# Patient Record
Sex: Male | Born: 1990 | Race: Black or African American | Hispanic: No | Marital: Single | Smoking: Never smoker
Health system: Southern US, Community
[De-identification: ages and names within clinical notes are randomized; demographics above are authoritative.]

## PROBLEM LIST (undated history)

## (undated) DIAGNOSIS — T7840XA Allergy, unspecified, initial encounter: Secondary | ICD-10-CM

## (undated) HISTORY — DX: Allergy, unspecified, initial encounter: T78.40XA

---

## 2013-05-31 ENCOUNTER — Emergency Department (HOSPITAL_COMMUNITY): Payer: No Typology Code available for payment source

## 2013-05-31 ENCOUNTER — Emergency Department (HOSPITAL_COMMUNITY)
Admission: EM | Admit: 2013-05-31 | Discharge: 2013-05-31 | Disposition: A | Discharge status: Home / Self Care | Payer: No Typology Code available for payment source | Attending: Emergency Medicine | Admitting: Emergency Medicine

## 2013-05-31 ENCOUNTER — Encounter (HOSPITAL_COMMUNITY): Payer: Self-pay | Admitting: Emergency Medicine

## 2013-05-31 DIAGNOSIS — S161XXA Strain of muscle, fascia and tendon at neck level, initial encounter: Secondary | ICD-10-CM

## 2013-05-31 DIAGNOSIS — IMO0002 Reserved for concepts with insufficient information to code with codable children: Secondary | ICD-10-CM | POA: Insufficient documentation

## 2013-05-31 DIAGNOSIS — S79919A Unspecified injury of unspecified hip, initial encounter: Secondary | ICD-10-CM | POA: Insufficient documentation

## 2013-05-31 DIAGNOSIS — Y92410 Unspecified street and highway as the place of occurrence of the external cause: Secondary | ICD-10-CM | POA: Insufficient documentation

## 2013-05-31 DIAGNOSIS — Y9389 Activity, other specified: Secondary | ICD-10-CM | POA: Insufficient documentation

## 2013-05-31 DIAGNOSIS — Z23 Encounter for immunization: Secondary | ICD-10-CM | POA: Insufficient documentation

## 2013-05-31 DIAGNOSIS — S139XXA Sprain of joints and ligaments of unspecified parts of neck, initial encounter: Secondary | ICD-10-CM | POA: Insufficient documentation

## 2013-05-31 DIAGNOSIS — S79912A Unspecified injury of left hip, initial encounter: Secondary | ICD-10-CM

## 2013-05-31 DIAGNOSIS — T07XXXA Unspecified multiple injuries, initial encounter: Secondary | ICD-10-CM

## 2013-05-31 MED ORDER — TETANUS-DIPHTH-ACELL PERTUSSIS 5-2.5-18.5 LF-MCG/0.5 IM SUSP
0.5000 mL | Freq: Once | INTRAMUSCULAR | Status: AC
  Administered 2013-05-31: 0.5 mL via INTRAMUSCULAR
  Filled 2013-05-31: qty 0.5

## 2013-05-31 MED ORDER — IBUPROFEN 800 MG PO TABS: 800.0000 mg | ORAL_TABLET | Freq: Three times a day (TID) | ORAL | Status: DC

## 2013-05-31 MED ORDER — FLUORESCEIN SODIUM 1 MG OP STRP
1.0000 | ORAL_STRIP | Freq: Once | OPHTHALMIC | Status: AC
  Administered 2013-05-31: 2 via OPHTHALMIC
  Filled 2013-05-31: qty 1

## 2013-05-31 MED ORDER — KETOROLAC TROMETHAMINE 60 MG/2ML IM SOLN
60.0000 mg | Freq: Once | INTRAMUSCULAR | Status: AC
  Administered 2013-05-31: 60 mg via INTRAMUSCULAR
  Filled 2013-05-31: qty 2

## 2013-05-31 MED ORDER — HYDROCODONE-ACETAMINOPHEN 5-325 MG PO TABS: 1.0000 | ORAL_TABLET | Freq: Four times a day (QID) | ORAL | Status: DC | PRN

## 2013-05-31 MED ORDER — TETRACAINE HCL 0.5 % OP SOLN
2.0000 [drp] | Freq: Once | OPHTHALMIC | Status: AC
  Administered 2013-05-31: 2 [drp] via OPHTHALMIC
  Filled 2013-05-31: qty 2

## 2013-05-31 MED ORDER — CYCLOBENZAPRINE HCL 10 MG PO TABS: 10.0000 mg | ORAL_TABLET | Freq: Two times a day (BID) | ORAL | Status: DC | PRN

## 2013-05-31 NOTE — ED Notes (Signed)
Pt transported to XR.  
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
## 2013-05-31 NOTE — ED Notes (Signed)
Pt returned from XR, placed back on monitor. 

## 2013-05-31 NOTE — ED Notes (Signed)
Dr. Dierdre Highman in at the bedside.
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
## 2013-05-31 NOTE — ED Notes (Signed)
Pt requesting pain medication. RN to give held toradol

## 2013-05-31 NOTE — ED Notes (Signed)
Pt offered toradol for pain control, but pt does not want pain medicine at this time. RN instructed pt to alert RN if he changes his mind.

## 2014-07-21 ENCOUNTER — Ambulatory Visit (INDEPENDENT_AMBULATORY_CARE_PROVIDER_SITE_OTHER): Payer: BC Managed Care – PPO | Admitting: Emergency Medicine

## 2014-07-21 VITALS — BP 138/72 | HR 68 | Temp 97.9°F | Resp 16 | Ht 67.0 in | Wt 160.0 lb

## 2014-07-21 DIAGNOSIS — L03011 Cellulitis of right finger: Secondary | ICD-10-CM

## 2014-07-21 MED ORDER — SULFAMETHOXAZOLE-TMP DS 800-160 MG PO TABS: 1.0000 | ORAL_TABLET | Freq: Two times a day (BID) | ORAL | Status: DC

## 2014-07-21 NOTE — Patient Instructions (Signed)

## 2014-07-21 NOTE — Progress Notes (Signed)
[facility] [address], [place] [id number] [phone]  Date:  07/21/2014   Name:  Henry MiyamotoKeith Hall   DOB:  1991-09-06   MRN:  [id number]  PCP:  No PCP Per Patient    Chief Complaint: Hand Pain   History of Present Illness:  Henry Hall is a 23 y.o. very pleasant male patient who presents with the following:  Increasingly more painful right fourth finger.  Now has purulent accumulation. No fever or chills.  No history of injury. No improvement with over the counter medications or other home remedies.  Denies other complaint or health concern today.   There are no active problems to display for this patient.   History reviewed. No pertinent past medical history.  History reviewed. No pertinent past surgical history.  History  Substance Use Topics  . Smoking status: Never Smoker   . Smokeless tobacco: Not on file  . Alcohol Use: Not on file    History reviewed. No pertinent family history.  No Known Allergies  Medication list has been reviewed and updated.  No current outpatient prescriptions on file prior to visit.   No current facility-administered medications on file prior to visit.    Review of Systems:  As per HPI, otherwise negative.    Physical Examination: Filed Vitals:   07/21/14 1804  BP: 138/72  Pulse: 68  Temp: 97.9 F (36.6 C)  Resp: 16   Filed Vitals:   07/21/14 1804  Height: 5\' 7"  (1.702 m)  Weight: 160 lb (72.576 kg)   Body mass index is 25.05 kg/(m^2). Ideal Body Weight: Weight in (lb) to have BMI = 25: 159.3   GEN: WDWN, NAD, Non-toxic, Alert & Oriented x 3 HEENT: Atraumatic, Normocephalic.  Ears and Nose: No external deformity. EXTR: No clubbing/cyanosis/edema NEURO: Normal gait.  PSYCH: Normally interactive. Conversant. Not depressed or anxious appearing.  Calm demeanor.  Right hand:  Fourth finger paronychia.   Assessment and Plan: Paronychia Aspiration Septra   Signed,  Henry OdorJeffery Deklyn Trachtenberg,  MD

## 2015-06-13 ENCOUNTER — Emergency Department (HOSPITAL_COMMUNITY)
Admission: EM | Admit: 2015-06-13 | Discharge: 2015-06-13 | Disposition: A | Discharge status: Home / Self Care | Payer: BLUE CROSS/BLUE SHIELD | Source: Home / Self Care | Attending: Family Medicine | Admitting: Family Medicine

## 2015-06-13 ENCOUNTER — Encounter (HOSPITAL_COMMUNITY): Payer: Self-pay | Admitting: *Deleted

## 2015-06-13 ENCOUNTER — Emergency Department (INDEPENDENT_AMBULATORY_CARE_PROVIDER_SITE_OTHER): Payer: BLUE CROSS/BLUE SHIELD

## 2015-06-13 DIAGNOSIS — S63259A Unspecified dislocation of unspecified finger, initial encounter: Secondary | ICD-10-CM | POA: Diagnosis not present

## 2015-06-13 NOTE — ED Notes (Signed)
Assessment per Dr. Corey. 

## 2015-06-13 NOTE — ED Notes (Signed)
Finger splint applied per Dr. Denyse Amass.

## 2015-06-13 NOTE — Discharge Instructions (Signed)
Thank you for coming in today. Take up to 2 Aleve twice daily Use the splint Follow-up with me in a week   Finger Dislocation Finger dislocation is the displacement of bones in your finger at the joints. Most commonly, finger dislocation occurs at the proximal interphalangeal joint (the joint closest to your knuckle). Very strong, fibrous tissues (ligaments) and joint capsules connect the three bones of your fingers.  CAUSES Dislocation is caused by a forceful impact. This impact moves these bones off the joint and often tears your ligaments.  SYMPTOMS Symptoms of finger dislocation include:  Deformity of your finger.  Pain, with loss of movement. DIAGNOSIS  Finger dislocation is diagnosed with a physical exam. Often, X-ray exams are done to see if you have associated injuries, such as bone fractures. TREATMENT  Finger dislocations are treated by putting your bones back into position (reduction) either by manually moving the bones back into place or through surgery. Your finger is then kept in a fixed position (immobilized) with the use of a dressing or splint for a brief period. When your ligament has to be surgically repaired, it needs to be kept in a fixed position with a dressing or splint for 1 to 2 weeks. Because joint stiffness is a long-term complication of finger dislocation, hand exercises or physical therapy to increase the range of motion and to regain strength is usually started as soon as the ligament is healed. Exercises and therapy generally last no more than 3 months. HOME CARE INSTRUCTIONS The following measures can help to reduce pain and speed up the healing process:  Rest your injured joint. Do not move until instructed otherwise by your caregiver. Avoid activities similar to the one that caused your injury.  Apply ice to your injured joint for the first day or 2 after your reduction or as directed by your caregiver. Applying ice helps to reduce inflammation and  pain.  Put ice in a plastic bag.  Place a towel between your skin and the bag.  Leave the ice on for 15-20 minutes at a time, every 2 hours while you are awake.  Elevate your hand above your heart as directed by your caregiver to reduce swelling.  Take over-the-counter or prescription medicine for pain as your caregiver instructs you. SEEK IMMEDIATE MEDICAL CARE IF:  Your dressing or splint becomes damaged.  Your pain becomes worse rather than better.  You lose feeling in your finger, or it becomes cold and white. MAKE SURE YOU:  Understand these instructions.  Will watch your condition.  Will get help right away if you are not doing well or get worse. Document Released: 09/16/2000 Document Revised: 12/12/2011 Document Reviewed: 07/10/2011 [facility] Patient Information 2015 [place], [place]. This information is not intended to replace advice given to you by your health care provider. Make sure you discuss any questions you have with your health care provider.

## 2015-06-13 NOTE — ED Provider Notes (Signed)
Henry Hall is a 24 y.o. male who presents to [facility] today for left fifth digit PIP dislocation. Patient suffered a dislocation to his left fifth PIP joint of his left hand playing flag football at school today. This is his second dislocation of the same joint. He notes pain and deformity. He has not tried any treatment yet.   History reviewed. No pertinent past medical history. History reviewed. No pertinent past surgical history. Social History  Substance Use Topics  . Smoking status: Never Smoker   . Smokeless tobacco: Not on file  . Alcohol Use: Not on file   ROS as above Medications: No current facility-administered medications for this encounter.   No current outpatient prescriptions on file.   No Known Allergies   Exam:  BP 137/83 mmHg  Pulse 115  Temp(Src) 98.6 F (37 C) (Oral)  Resp 16  SpO2 99% Gen: Well NAD HEENT: EOMI,  MMM Lungs: Normal work of breathing. CTABL Heart: RRR no MRG heart rate following joint reduction 80 bpm Abd: NABS, Soft. Nondistended, Nontender Exts: Brisk capillary refill, warm and well perfused.   Reduction of dislocation: The finger was distracted and flexed. The joint easily was reduced back into a more anatomical position. Patient notes pain improved.  No results found for this or any previous visit (from the past 24 hour(s)). Dg Finger Little Left  06/13/2015   CLINICAL DATA:  Left finger dislocation after pulling injury. Initial encounter.  EXAM: LEFT LITTLE FINGER 2+V  COMPARISON:  None.  FINDINGS: No dislocation is noted. Small bone fragment is seen posterior to the proximal interphalangeal joint of the fifth finger concerning for small avulsion fracture of indeterminate age joint spaces are otherwise intact. Mild soft tissue swelling is noted. No radiopaque foreign body seen.  IMPRESSION: Probable small avulsion fracture seen involving proximal base of fifth middle phalanx of indeterminate age. No dislocation is noted currently.    Electronically Signed   By: Lupita Raider, M.D.   On: 06/13/2015 18:12    Assessment and Plan: 24 y.o. male with dislocation status post reduction with dorsal avulsion fracture. Patient was splinted across the PIP with free motion of the DIP. He will follow-up with me at the sports medicine in a week.  Discussed warning signs or symptoms. Please see discharge instructions. Patient expresses understanding.     Rodolph Bong, MD 06/13/15 [phone]

## 2015-08-25 ENCOUNTER — Ambulatory Visit (INDEPENDENT_AMBULATORY_CARE_PROVIDER_SITE_OTHER): Payer: BLUE CROSS/BLUE SHIELD | Admitting: Emergency Medicine

## 2015-08-25 VITALS — BP 126/80 | HR 65 | Temp 97.6°F | Resp 16 | Ht 68.0 in | Wt 157.0 lb

## 2015-08-25 DIAGNOSIS — J209 Acute bronchitis, unspecified: Secondary | ICD-10-CM | POA: Diagnosis not present

## 2015-08-25 DIAGNOSIS — J0140 Acute pansinusitis, unspecified: Secondary | ICD-10-CM | POA: Diagnosis not present

## 2015-08-25 MED ORDER — ALBUTEROL SULFATE (2.5 MG/3ML) 0.083% IN NEBU
5.0000 mg | INHALATION_SOLUTION | Freq: Once | RESPIRATORY_TRACT | Status: AC
  Administered 2015-08-25: 5 mg via RESPIRATORY_TRACT

## 2015-08-25 MED ORDER — PSEUDOEPHEDRINE-GUAIFENESIN ER 60-600 MG PO TB12: 1.0000 | ORAL_TABLET | Freq: Two times a day (BID) | ORAL | Status: DC

## 2015-08-25 MED ORDER — AMOXICILLIN-POT CLAVULANATE 875-125 MG PO TABS: 1.0000 | ORAL_TABLET | Freq: Two times a day (BID) | ORAL | Status: DC

## 2015-08-25 MED ORDER — HYDROCOD POLST-CPM POLST ER 10-8 MG/5ML PO SUER: 5.0000 mL | Freq: Two times a day (BID) | ORAL | Status: DC

## 2015-08-25 MED ORDER — IPRATROPIUM BROMIDE 0.02 % IN SOLN
0.5000 mg | Freq: Once | RESPIRATORY_TRACT | Status: AC
  Administered 2015-08-25: 0.5 mg via RESPIRATORY_TRACT

## 2015-08-25 MED ORDER — ALBUTEROL SULFATE HFA 108 (90 BASE) MCG/ACT IN AERS: 2.0000 | INHALATION_SPRAY | RESPIRATORY_TRACT | Status: DC | PRN

## 2015-08-25 NOTE — Patient Instructions (Signed)
Metered Dose Inhaler (No Spacer Used) Inhaled medicines are the basis of treatment for asthma and other breathing problems. Inhaled medicine can only be effective if used properly. Good technique assures that the medicine reaches the lungs. Metered dose inhalers (MDIs) are used to deliver a variety of inhaled medicines. These include quick relief or rescue medicines (such as bronchodilators) and controller medicines (such as corticosteroids). The medicine is delivered by pushing down on a metal canister to release a set amount of spray. If you are using different kinds of inhalers, use your quick relief medicine to open the airways 10-15 minutes before using a steroid, if instructed to do so by your health care provider. If you are unsure which inhalers to use and the order of using them, ask your health care provider, nurse, or respiratory therapist. HOW TO USE THE INHALER 1. Remove the cap from the inhaler. 2. If you are using the inhaler for the first time, you will need to prime it. Shake the inhaler for 5 seconds and release four puffs into the air, away from your face. Ask your health care provider or pharmacist if you have questions about priming your inhaler. 3. Shake the inhaler for 5 seconds before each breath in (inhalation). 4. Position the inhaler so that the top of the canister faces up. 5. Put your index finger on the top of the medicine canister. Your thumb supports the bottom of the inhaler. 6. Open your mouth. 7. Either place the inhaler between your teeth and place your lips tightly around the mouthpiece, or hold the inhaler 1-2 inches away from your open mouth. If you are unsure of which technique to use, ask your health care provider. 8. Breathe out (exhale) normally and as completely as possible. 9. Press the canister down with the index finger to release the medicine. 10. At the same time as the canister is pressed, inhale deeply and slowly until your lungs are completely filled.  This should take 4-6 seconds. Keep your tongue down. 11. Hold the medicine in your lungs for 5-10 seconds (10 seconds is best). This helps the medicine get into the small airways of your lungs. 12. Breathe out slowly, through pursed lips. Whistling is an example of pursed lips. 13. Wait at least 1 minute between puffs. Continue with the above steps until you have taken the number of puffs your health care provider has ordered. Do not use the inhaler more than your health care provider directs you to. 14. Replace the cap on the inhaler. 15. Follow the directions from your health care provider or the inhaler insert for cleaning the inhaler. If you are using a steroid inhaler, after your last puff, rinse your mouth with water, gargle, and spit out the water. Do not swallow the water. AVOID:  Inhaling before or after starting the spray of medicine. It takes practice to coordinate your breathing with triggering the spray.  Inhaling through the nose (rather than the mouth) when triggering the spray. HOW TO DETERMINE IF YOUR INHALER IS FULL OR NEARLY EMPTY You cannot know when an inhaler is empty by shaking it. Some inhalers are now being made with dose counters. Ask your health care provider for a prescription that has a dose counter if you feel you need that extra help. If your inhaler does not have a counter, ask your health care provider to help you determine the date you need to refill your inhaler. Write the refill date on a calendar or your inhaler canister. Refill   your inhaler 7-10 days before it runs out. Be sure to keep an adequate supply of medicine. This includes making sure it has not expired, and making sure you have a spare inhaler. SEEK MEDICAL CARE IF:  Symptoms are only partially relieved with your inhaler.  You are having trouble using your inhaler.  You experience an increase in phlegm. SEEK IMMEDIATE MEDICAL CARE IF:  You feel little or no relief with your inhalers. You are still  wheezing and feeling shortness of breath, tightness in your chest, or both.  You have dizziness, headaches, or a fast heart rate.  You have chills, fever, or night sweats.  There is a noticeable increase in phlegm production, or there is blood in the phlegm. MAKE SURE YOU:  Understand these instructions.  Will watch your condition.  Will get help right away if you are not doing well or get worse.   This information is not intended to replace advice given to you by your health care provider. Make sure you discuss any questions you have with your health care provider.   Document Released: 07/17/2007 Document Revised: 10/10/2014 Document Reviewed: 03/07/2013 [employer] Interactive Patient Education 2016 [employer]  

## 2015-08-25 NOTE — Progress Notes (Signed)
Subjective:  Patient ID: Henry Hall, male    DOB: 03/08/1991  Age: 24 y.o. MRN: [id number]  CC: Sore Throat; Cough; and Wheezing   HPI Henry MiyamotoKeith Hall presents  with nasal congestion nasal discharge is purulent color. He has postnasal drip. He has a cough productive of purulent sputum. He has moderate wheezing. He has used an inhaler one time in his life previously for an upper respiratory infection with reactive airway disease. He has no fever or chills. He has marked pressure in and around his eyes. He's had no improvement with over-the-counter medication  History Henry Hall has no past medical history on file.   He has no past surgical history on file.   His  family history is not on file.  He   reports that he has never smoked. He does not have any smokeless tobacco history on file. His alcohol and drug histories are not on file.  No outpatient prescriptions prior to visit.   No facility-administered medications prior to visit.    Social History   Social History  . Marital Status: Single    Spouse Name: N/A  . Number of Children: N/A  . Years of Education: N/A   Social History Main Topics  . Smoking status: Never Smoker   . Smokeless tobacco: None  . Alcohol Use: None  . Drug Use: None  . Sexual Activity: Not Asked   Other Topics Concern  . None   Social History Narrative     Review of Systems  Constitutional: Positive for fatigue. Negative for fever, chills and appetite change.  HENT: Positive for congestion, postnasal drip, rhinorrhea and sinus pressure. Negative for ear pain and sore throat.   Eyes: Negative for pain and redness.  Respiratory: Positive for cough and wheezing. Negative for shortness of breath.   Cardiovascular: Negative for leg swelling.  Gastrointestinal: Negative for nausea, vomiting, abdominal pain, diarrhea, constipation and blood in stool.  Endocrine: Negative for polyuria.  Genitourinary: Negative for dysuria, urgency, frequency and  flank pain.  Musculoskeletal: Negative for gait problem.  Skin: Negative for rash.  Neurological: Negative for weakness and headaches.  Psychiatric/Behavioral: Negative for confusion and decreased concentration. The patient is not nervous/anxious.     Objective:  BP 126/80 mmHg  Pulse 65  Temp(Src) 97.6 F (36.4 C) (Oral)  Resp 16  Ht 5\' 8"  (1.727 m)  Wt 157 lb (71.215 kg)  BMI 23.88 kg/m2  SpO2 98%  Physical Exam  Constitutional: He is oriented to person, place, and time. He appears well-developed and well-nourished. No distress.  HENT:  Head: Normocephalic and atraumatic.  Right Ear: External ear normal.  Left Ear: External ear normal.  Nose: Nose normal.  Eyes: Conjunctivae and EOM are normal. Pupils are equal, round, and reactive to light. No scleral icterus.  Neck: Normal range of motion. Neck supple. No tracheal deviation present.  Cardiovascular: Normal rate, regular rhythm and normal heart sounds.   Pulmonary/Chest: Effort normal. No respiratory distress. He has no wheezes. He has no rales.  Abdominal: He exhibits no mass. There is no tenderness. There is no rebound and no guarding.  Musculoskeletal: He exhibits no edema.  Lymphadenopathy:    He has no cervical adenopathy.  Neurological: He is alert and oriented to person, place, and time. Coordination normal.  Skin: Skin is warm and dry. No rash noted.  Psychiatric: He has a normal mood and affect. His behavior is normal.      Assessment & Plan:   Henry Hall was seen  today for sore throat, cough and wheezing.  Diagnoses and all orders for this visit:  Acute bronchitis, unspecified organism -     albuterol (PROVENTIL) (2.5 MG/3ML) 0.083% nebulizer solution 5 mg; Take 6 mLs (5 mg total) by nebulization once. -     ipratropium (ATROVENT) nebulizer solution 0.5 mg; Take 2.5 mLs (0.5 mg total) by nebulization once.  Acute pansinusitis, recurrence not specified  Other orders -     albuterol (PROVENTIL HFA;VENTOLIN  HFA) 108 (90 BASE) MCG/ACT inhaler; Inhale 2 puffs into the lungs every 4 (four) hours as needed for wheezing or shortness of breath (cough, shortness of breath or wheezing.). -     pseudoephedrine-guaifenesin (MUCINEX D) 60-600 MG 12 hr tablet; Take 1 tablet by mouth every 12 (twelve) hours. -     chlorpheniramine-HYDROcodone (TUSSIONEX PENNKINETIC ER) 10-8 MG/5ML SUER; Take 5 mLs by mouth 2 (two) times daily. -     amoxicillin-clavulanate (AUGMENTIN) 875-125 MG tablet; Take 1 tablet by mouth 2 (two) times daily.  I am having Henry Hall start on albuterol, pseudoephedrine-guaifenesin, chlorpheniramine-HYDROcodone, and amoxicillin-clavulanate. We administered albuterol and ipratropium.  Meds ordered this encounter  Medications  . albuterol (PROVENTIL) (2.5 MG/3ML) 0.083% nebulizer solution 5 mg    Sig:   . ipratropium (ATROVENT) nebulizer solution 0.5 mg    Sig:   . albuterol (PROVENTIL HFA;VENTOLIN HFA) 108 (90 BASE) MCG/ACT inhaler    Sig: Inhale 2 puffs into the lungs every 4 (four) hours as needed for wheezing or shortness of breath (cough, shortness of breath or wheezing.).    Dispense:  1 Inhaler    Refill:  1  . pseudoephedrine-guaifenesin (MUCINEX D) 60-600 MG 12 hr tablet    Sig: Take 1 tablet by mouth every 12 (twelve) hours.    Dispense:  18 tablet    Refill:  0  . chlorpheniramine-HYDROcodone (TUSSIONEX PENNKINETIC ER) 10-8 MG/5ML SUER    Sig: Take 5 mLs by mouth 2 (two) times daily.    Dispense:  60 mL    Refill:  0  . amoxicillin-clavulanate (AUGMENTIN) 875-125 MG tablet    Sig: Take 1 tablet by mouth 2 (two) times daily.    Dispense:  20 tablet    Refill:  0   He had good improvement with nebulized aerosol. He'll follow-up after he completes antibiotic for new or worse symptoms  Appropriate red flag conditions were discussed with the patient as well as actions that should be taken.  Patient expressed his understanding.  Follow-up: Return if symptoms worsen or fail to  improve.  Carmelina Dane, MD

## 2015-11-10 ENCOUNTER — Ambulatory Visit (INDEPENDENT_AMBULATORY_CARE_PROVIDER_SITE_OTHER): Payer: BLUE CROSS/BLUE SHIELD | Admitting: Family Medicine

## 2015-11-10 VITALS — BP 130/70 | HR 87 | Temp 98.3°F | Resp 16 | Ht 68.0 in | Wt 157.0 lb

## 2015-11-10 DIAGNOSIS — J209 Acute bronchitis, unspecified: Secondary | ICD-10-CM

## 2015-11-10 MED ORDER — HYDROCODONE-HOMATROPINE 5-1.5 MG/5ML PO SYRP: 5.0000 mL | ORAL_SOLUTION | Freq: Three times a day (TID) | ORAL | Status: DC | PRN

## 2015-11-10 MED ORDER — ALBUTEROL SULFATE HFA 108 (90 BASE) MCG/ACT IN AERS: 2.0000 | INHALATION_SPRAY | RESPIRATORY_TRACT | Status: DC | PRN

## 2015-11-10 MED ORDER — AZITHROMYCIN 250 MG PO TABS: ORAL_TABLET | ORAL | Status: DC

## 2015-11-10 MED ORDER — ALBUTEROL SULFATE (2.5 MG/3ML) 0.083% IN NEBU
2.5000 mg | INHALATION_SOLUTION | Freq: Once | RESPIRATORY_TRACT | Status: AC
  Administered 2015-11-10: 2.5 mg via RESPIRATORY_TRACT

## 2015-11-10 NOTE — Patient Instructions (Signed)
Asthma, Acute Bronchospasm Acute bronchospasm caused by asthma is also referred to as an asthma attack. Bronchospasm means your air passages become narrowed. The narrowing is caused by inflammation and tightening of the muscles in the air tubes (bronchi) in your lungs. This can make it hard to breathe or cause you to wheeze and cough. CAUSES Possible triggers are:  Animal dander from the skin, hair, or feathers of animals.  Dust mites contained in house dust.  Cockroaches.  Pollen from trees or grass.  Mold.  Cigarette or tobacco smoke.  Air pollutants such as dust, household cleaners, hair sprays, aerosol sprays, paint fumes, strong chemicals, or strong odors.  Cold air or weather changes. Cold air may trigger inflammation. Winds increase molds and pollens in the air.  Strong emotions such as crying or laughing hard.  Stress.  Certain medicines such as aspirin or beta-blockers.  Sulfites in foods and drinks, such as dried fruits and wine.  Infections or inflammatory conditions, such as a flu, cold, or inflammation of the nasal membranes (rhinitis).  Gastroesophageal reflux disease (GERD). GERD is a condition where stomach acid backs up into your esophagus.  Exercise or strenuous activity. SIGNS AND SYMPTOMS   Wheezing.  Excessive coughing, particularly at night.  Chest tightness.  Shortness of breath. DIAGNOSIS  Your health care provider will ask you about your medical history and perform a physical exam. A chest X-ray or blood testing may be performed to look for other causes of your symptoms or other conditions that may have triggered your asthma attack. TREATMENT  Treatment is aimed at reducing inflammation and opening up the airways in your lungs. Most asthma attacks are treated with inhaled medicines. These include quick relief or rescue medicines (such as bronchodilators) and controller medicines (such as inhaled corticosteroids). These medicines are sometimes  given through an inhaler or a nebulizer. Systemic steroid medicine taken by mouth or given through an IV tube also can be used to reduce the inflammation when an attack is moderate or severe. Antibiotic medicines are only used if a bacterial infection is present.  HOME CARE INSTRUCTIONS   Rest.  Drink plenty of liquids. This helps the mucus to remain thin and be easily coughed up. Only use caffeine in moderation and do not use alcohol until you have recovered from your illness.  Do not smoke. Avoid being exposed to secondhand smoke.  You play a critical role in keeping yourself in good health. Avoid exposure to things that cause you to wheeze or to have breathing problems.  Keep your medicines up-to-date and available. Carefully follow your health care provider's treatment plan.  Take your medicine exactly as prescribed.  When pollen or pollution is bad, keep windows closed and use an air conditioner or go to places with air conditioning.  Asthma requires careful medical care. See your health care provider for a follow-up as advised. If you are more than [redacted] weeks pregnant and you were prescribed any new medicines, let your obstetrician know about the visit and how you are doing. Follow up with your health care provider as directed.  After you have recovered from your asthma attack, make an appointment with your outpatient doctor to talk about ways to reduce the likelihood of future attacks. If you do not have a doctor who manages your asthma, make an appointment with a primary care doctor to discuss your asthma. SEEK IMMEDIATE MEDICAL CARE IF:   You are getting worse.  You have trouble breathing. If severe, call your local   emergency services (911 in the [place]  You develop chest pain or discomfort.  You are vomiting.  You are not able to keep fluids down.  You are coughing up yellow, green, brown, or bloody sputum.  You have a fever and your symptoms suddenly get worse.  You have  trouble swallowing. MAKE SURE YOU:   Understand these instructions.  Will watch your condition.  Will get help right away if you are not doing well or get worse.   This information is not intended to replace advice given to you by your health care provider. Make sure you discuss any questions you have with your health care provider.   Document Released: 01/04/2007 Document Revised: 09/24/2013 Document Reviewed: 03/27/2013 Elsevier Interactive Patient Education 2016 [employer] Acute Bronchitis Bronchitis is inflammation of the airways that extend from the windpipe into the lungs (bronchi). The inflammation often causes mucus to develop. This leads to a cough, which is the most common symptom of bronchitis.  In acute bronchitis, the condition usually develops suddenly and goes away over time, usually in a couple weeks. Smoking, allergies, and asthma can make bronchitis worse. Repeated episodes of bronchitis may cause further lung problems.  CAUSES Acute bronchitis is most often caused by the same virus that causes a cold. The virus can spread from person to person (contagious) through coughing, sneezing, and touching contaminated objects. SIGNS AND SYMPTOMS   Cough.   Fever.   Coughing up mucus.   Body aches.   Chest congestion.   Chills.   Shortness of breath.   Sore throat.  DIAGNOSIS  Acute bronchitis is usually diagnosed through a physical exam. Your health care provider will also ask you questions about your medical history. Tests, such as chest X-rays, are sometimes done to rule out other conditions.  TREATMENT  Acute bronchitis usually goes away in a couple weeks. Oftentimes, no medical treatment is necessary. Medicines are sometimes given for relief of fever or cough. Antibiotic medicines are usually not needed but may be prescribed in certain situations. In some cases, an inhaler may be recommended to help reduce shortness of breath and control the cough. A  cool mist vaporizer may also be used to help thin bronchial secretions and make it easier to clear the chest.  HOME CARE INSTRUCTIONS  Get plenty of rest.   Drink enough fluids to keep your urine clear or pale yellow (unless you have a medical condition that requires fluid restriction). Increasing fluids may help thin your respiratory secretions (sputum) and reduce chest congestion, and it will prevent dehydration.   Take medicines only as directed by your health care provider.  If you were prescribed an antibiotic medicine, finish it all even if you start to feel better.  Avoid smoking and secondhand smoke. Exposure to cigarette smoke or irritating chemicals will make bronchitis worse. If you are a smoker, consider using nicotine gum or skin patches to help control withdrawal symptoms. Quitting smoking will help your lungs heal faster.   Reduce the chances of another bout of acute bronchitis by washing your hands frequently, avoiding people with cold symptoms, and trying not to touch your hands to your mouth, nose, or eyes.   Keep all follow-up visits as directed by your health care provider.  SEEK MEDICAL CARE IF: Your symptoms do not improve after 1 week of treatment.  SEEK IMMEDIATE MEDICAL CARE IF:  You develop an increased fever or chills.   You have chest pain.   You have severe shortness of   breath.  You have bloody sputum.   You develop dehydration.  You faint or repeatedly feel like you are going to pass out.  You develop repeated vomiting.  You develop a severe headache. MAKE SURE YOU:   Understand these instructions.  Will watch your condition.  Will get help right away if you are not doing well or get worse.   This information is not intended to replace advice given to you by your health care provider. Make sure you discuss any questions you have with your health care provider.   Document Released: 10/27/2004 Document Revised: 10/10/2014 Document  Reviewed: 03/12/2013 Elsevier Interactive Patient Education 2016 Elsevier Inc[employer]  

## 2015-11-10 NOTE — Progress Notes (Signed)
Subjective:  By signing my name below, I, Henry Hall, attest that this documentation has been prepared under the direction and in the presence of Norberto Sorenson, MD.  Henry Hall, Medical Scribe. 11/10/2015.  11:53 AM.    Patient ID: Henry Hall, male    DOB: 31-May-1991, 25 y.o.   MRN: [id number]  Chief Complaint  Patient presents with  . Cough  . URI  . Sinusitis    HPI HPI Comments: Henry Hall is a 25 y.o. male who presents to [facility] complaining of possible URI, gradual onset 3 days ago.  Pt was on Augmentin several months prior for sinus infection. Pt was prescribed an inhaler for this. Pt notes that he has associated symptoms of cough, chest congestion, wheezing, sinus pressure, and sleep disturbance secondary to coughing episodes. Pt has used some OTC medications for this, and notes finding good relief with it. Pt does not reports a history of asthma. He is not a smoker, however notes that his work environment is surrounded by smoke and dust particles. Pt does have a family history of asthma. Pt denies chest pain , sore throat., or shortness of breath.   Pt works as a Psychologist, occupational.    There are no active problems to display for this patient.  No past medical history on file. No past surgical history on file. No Known Allergies Prior to Admission medications   Medication Sig Start Date End Date Taking? Authorizing Provider  albuterol (PROVENTIL HFA;VENTOLIN HFA) 108 (90 BASE) MCG/ACT inhaler Inhale 2 puffs into the lungs every 4 (four) hours as needed for wheezing or shortness of breath (cough, shortness of breath or wheezing.). Patient not taking: Reported on 11/10/2015 08/25/15   Carmelina Dane, MD   Social History   Social History  . Marital Status: Single    Spouse Name: N/A  . Number of Children: N/A  . Years of Education: N/A   Occupational History  . Not on file.   Social History Main Topics  . Smoking status: Never Smoker   .  Smokeless tobacco: Not on file  . Alcohol Use: Not on file  . Drug Use: Not on file  . Sexual Activity: Not on file   Other Topics Concern  . Not on file   Social History Narrative    Review of Systems  HENT: Positive for congestion and sinus pressure. Negative for sore throat.   Respiratory: Positive for cough and wheezing. Negative for shortness of breath.   Cardiovascular: Negative for chest pain.  Psychiatric/Behavioral: Positive for sleep disturbance.      Objective:   Physical Exam  Constitutional: He is oriented to person, place, and time. He appears well-developed and well-nourished. No distress.  HENT:  Head: Normocephalic and atraumatic.  Right Ear: Tympanic membrane is erythematous. A middle ear effusion is present.  Left Ear: Tympanic membrane normal.  Purulent rhinitis right greater than left.  Oropharynx with erythema, 2+ tonsil, some edema, no exudate.   Eyes: EOM are normal. Pupils are equal, round, and reactive to light.  Neck: Neck supple.  Normal thyroid.   Cardiovascular: Normal rate.   Pulmonary/Chest: Effort normal. He has wheezes.  Dec air movement with expiratory wheezing throughout.   Lymphadenopathy:  Tonsillar adenopathy, and little anterior cervical adenopathy BL.  Neurological: He is alert and oriented to person, place, and time. No cranial nerve deficit.  Skin: Skin is warm and dry.  Psychiatric: He has a normal mood and affect. His  behavior is normal.  Nursing note and vitals reviewed.   BP 130/70 mmHg  Pulse 87  Temp(Src) 98.3 F (36.8 C) (Oral)  Resp 16  Ht  (1.727 m)  Wt 157 lb (71.215 kg)  BMI 23.88 kg/m2  SpO2 96%     Assessment & Plan:   1. Acute bronchitis, unspecified organism     Meds ordered this encounter  Medications  . albuterol (PROVENTIL) (2.5 MG/3ML) 0.083% nebulizer solution 2.5 mg    Sig:   . albuterol (PROVENTIL HFA;VENTOLIN HFA) 108 (90 Base) MCG/ACT inhaler    Sig: Inhale 2 puffs into the lungs  every 4 (four) hours as needed for wheezing or shortness of breath (cough, shortness of breath or wheezing.).    Dispense:  1 Inhaler    Refill:  2    Ok to substitute whatever type of albuterol inhaler is least expensive with his insurance.  Marland Kitchen azithromycin (ZITHROMAX) 250 MG tablet    Sig: Take 2 tabs PO x 1 dose, then 1 tab PO QD x 4 days    Dispense:  6 tablet    Refill:  0  . HYDROcodone-homatropine (HYCODAN) 5-1.5 MG/5ML syrup    Sig: Take 5 mLs by mouth every 8 (eight) hours as needed for cough. Do not take while working or driving.    Dispense:  120 mL    Refill:  0    I personally performed the services described in this documentation, which was scribed in my presence. The recorded information has been reviewed and considered, and addended by me as needed.  Norberto Sorenson, MD MPH

## 2016-01-14 ENCOUNTER — Ambulatory Visit (INDEPENDENT_AMBULATORY_CARE_PROVIDER_SITE_OTHER): Payer: BLUE CROSS/BLUE SHIELD | Admitting: Family Medicine

## 2016-01-14 VITALS — BP 126/74 | HR 64 | Temp 97.6°F | Resp 16 | Ht 68.0 in | Wt 159.0 lb

## 2016-01-14 DIAGNOSIS — J4530 Mild persistent asthma, uncomplicated: Secondary | ICD-10-CM | POA: Diagnosis not present

## 2016-01-14 DIAGNOSIS — J309 Allergic rhinitis, unspecified: Secondary | ICD-10-CM | POA: Diagnosis not present

## 2016-01-14 MED ORDER — BECLOMETHASONE DIPROPIONATE 80 MCG/ACT IN AERS: 1.0000 | INHALATION_SPRAY | Freq: Two times a day (BID) | RESPIRATORY_TRACT | Status: DC

## 2016-01-14 MED ORDER — FLUTICASONE PROPIONATE 50 MCG/ACT NA SUSP: 2.0000 | Freq: Every day | NASAL | Status: DC

## 2016-01-14 MED ORDER — ALBUTEROL SULFATE HFA 108 (90 BASE) MCG/ACT IN AERS: 1.0000 | INHALATION_SPRAY | RESPIRATORY_TRACT | Status: DC | PRN

## 2016-01-14 NOTE — Progress Notes (Signed)
Subjective:  By signing my name below, I, Stann Ore, attest that this documentation has been prepared under the direction and in the presence of Meredith Staggers, MD. Electronically Signed: Stann Ore, Scribe. 01/14/2016 , 11:01 AM .  Patient was seen in Room 5 .   Patient ID: Henry Hall, male    DOB: June 27, 1991, 25 y.o.   MRN: [id number] Chief Complaint  Patient presents with  . URI  . Allergies   HPI Henry Hall is a 25 y.o. male He was treated for acute bronchitis in February. Albuterol inhaler was given at that time for some wheezing. No known personal history of asthma, but does have family history (his mother) of asthma based on that visit.   He noticed sneezing with rhinorrhea, alternating nasal congestion and postnasal drainage starting back up recently. He states still having intermittent wheezing. He was previously prescribed zpak for relief, but states URI symptoms returned after the medication was finished. He was also prescribed albuterol inhaler, but he ran out last week; he was using it BID. He's taken zinc pills from a vitamin store. He's also been using flonase nasal spray, 3 sprays each nostril, a couple times a week mostly as needed. He informs taking Equate allergy pills daily. He denies fever. He denies any smoking.   He denies any shortness of breath, chest pain or wheezing when playing sports.   He works in [employer], around a lot of smoke and dust. He wears a mask at work.   There are no active problems to display for this patient.  No past medical history on file. No past surgical history on file. No Known Allergies Prior to Admission medications   Not on File   Social History   Social History  . Marital Status: Single    Spouse Name: N/A  . Number of Children: N/A  . Years of Education: N/A   Occupational History  . Not on file.   Social History Main Topics  . Smoking status: Never Smoker   . Smokeless tobacco: Not on file  . Alcohol Use:  Not on file  . Drug Use: Not on file  . Sexual Activity: Not on file   Other Topics Concern  . Not on file   Social History Narrative   Review of Systems  Constitutional: Negative for fever, chills and fatigue.  HENT: Positive for congestion, postnasal drip, rhinorrhea and sneezing.   Respiratory: Positive for wheezing. Negative for cough and shortness of breath.   Cardiovascular: Negative for chest pain.  Gastrointestinal: Negative for nausea, vomiting and diarrhea.      Objective:   Physical Exam  Constitutional: He is oriented to person, place, and time. He appears well-developed and well-nourished.  HENT:  Head: Normocephalic and atraumatic.  Right Ear: Tympanic membrane, external ear and ear canal normal.  Left Ear: Tympanic membrane, external ear and ear canal normal.  Nose: Mucosal edema (L>R) and rhinorrhea (minimal clear) present. Right sinus exhibits no maxillary sinus tenderness and no frontal sinus tenderness. Left sinus exhibits no maxillary sinus tenderness and no frontal sinus tenderness.  Mouth/Throat: Oropharynx is clear and moist and mucous membranes are normal. No oropharyngeal exudate or posterior oropharyngeal erythema.  Eyes: Conjunctivae are normal. Pupils are equal, round, and reactive to light.  Neck: Neck supple.  Cardiovascular: Normal rate, regular rhythm, normal heart sounds and intact distal pulses.   No murmur heard. Pulmonary/Chest: Effort normal and breath sounds normal. He has no wheezes. He has no rhonchi. He  has no rales.  Abdominal: Soft. There is no tenderness.  Lymphadenopathy:    He has no cervical adenopathy.  Neurological: He is alert and oriented to person, place, and time.  Skin: Skin is warm and dry. No rash noted.  Psychiatric: He has a normal mood and affect. His behavior is normal.  Vitals reviewed.   Filed Vitals:   01/14/16 1004  BP: 126/74  Pulse: 64  Temp: 97.6 F (36.4 C)  TempSrc: Oral  Resp: 16  Height:   (1.727 m)  Weight: 159 lb (72.122 kg)  SpO2: 98%    Spirometry FVC and FEV1 within normal limits. See flow chart    Assessment & Plan:   Wasyl Dornfeld is a 25 y.o. male Allergic rhinitis, unspecified allergic rhinitis type - Plan: fluticasone (FLONASE) 50 MCG/ACT nasal spray, CANCELED: Spirometry: Peak  Reactive airway disease with wheezing, mild persistent, uncomplicated - Plan: PFT PULM FXN SPIROMETRY (94010), albuterol (PROVENTIL HFA;VENTOLIN HFA) 108 (90 Base) MCG/ACT inhaler, beclomethasone (QVAR) 80 MCG/ACT inhaler, CANCELED: Spirometry: Peak  Allergic rhinitis with reactive airway versus undiagnosed asthma. Reassuring spirometry with Previous use of albuterol would indicate mild to moderate persistent, but may have been using albuterol when he did not have wheezing.  -Continue antihistamine once per day, Flonase nasal spray 2 sprays per nostril every day.  --Continue albuterol if needed for wheezing, but if frequent use of albuterol, or nighttime symptoms, start Qvar 80 g per inhalation twice per day.  -Follow-up in next 2-3 months. Sooner if worse.  Meds ordered this encounter  Medications  . albuterol (PROVENTIL HFA;VENTOLIN HFA) 108 (90 Base) MCG/ACT inhaler    Sig: Inhale 1-2 puffs into the lungs every 4 (four) hours as needed for wheezing or shortness of breath.    Dispense:  1 Inhaler    Refill:  0  . beclomethasone (QVAR) 80 MCG/ACT inhaler    Sig: Inhale 1 puff into the lungs 2 (two) times daily.    Dispense:  1 Inhaler    Refill:  2  . fluticasone (FLONASE) 50 MCG/ACT nasal spray    Sig: Place 2 sprays into both nostrils daily.    Dispense:  16 g    Refill:  6   Patient Instructions       IF you received an x-ray today, you will receive an invoice from [facility]. Please contact [facility] at [phone] with questions or concerns regarding your invoice.   IF you received labwork today, you will receive an invoice from [employer] [facility]. Please contact [facility] at [phone] with questions or concerns regarding your invoice.   Our billing staff will not be able to assist you with questions regarding bills from these companies.  You will be contacted with the lab results as soon as they are available. The fastest way to get your results is to activate your My Chart account. Instructions are located on the last page of this paperwork. If you have not heard from [place] regarding the results in 2 weeks, please contact this office.     Your symptoms appear to be due to allergies and possible asthma or reactive airway. For allergies, continue an antihistamine such as your tech, Claritin, or Allegra 1 pill once per day, as well as Flonase 2 sprays per nostril each day. I did send in some of this to your pharmacy.  For the wheezing, albuterol if only needed when wheezing.  If you are having to use albuterol more than twice per week,  or any nighttime wheezing symptoms, start the Qvar 1 puff inhaled twice per day, and follow-up with me in the next 2-3 months, sooner if any worsening of symptoms.  Return to the clinic or go to the nearest emergency room if any of your symptoms worsen or new symptoms occur.  Allergic Rhinitis Allergic rhinitis is when the mucous membranes in the nose respond to allergens. Allergens are particles in the air that cause your body to have an allergic reaction. This causes you to release allergic antibodies. Through a chain of events, these eventually cause you to release histamine into the blood stream. Although meant to protect the body, it is this release of histamine that causes your discomfort, such as frequent sneezing, congestion, and an itchy, runny nose.  CAUSES Seasonal allergic rhinitis (hay fever) is caused by pollen allergens that may come from grasses, trees, and weeds. Year-round allergic rhinitis (perennial allergic rhinitis) is caused by allergens such as house dust mites,  pet dander, and mold spores. SYMPTOMS  Nasal stuffiness (congestion).  Itchy, runny nose with sneezing and tearing of the eyes. DIAGNOSIS Your health care provider can help you determine the allergen or allergens that trigger your symptoms. If you and your health care provider are unable to determine the allergen, skin or blood testing may be used. Your health care provider will diagnose your condition after taking your health history and performing a physical exam. Your health care provider may assess you for other related conditions, such as asthma, pink eye, or an ear infection. TREATMENT Allergic rhinitis does not have a cure, but it can be controlled by:  Medicines that block allergy symptoms. These may include allergy shots, nasal sprays, and oral antihistamines.  Avoiding the allergen. Hay fever may often be treated with antihistamines in pill or nasal spray forms. Antihistamines block the effects of histamine. There are over-the-counter medicines that may help with nasal congestion and swelling around the eyes. Check with your health care provider before taking or giving this medicine. If avoiding the allergen or the medicine prescribed do not work, there are many new medicines your health care provider can prescribe. Stronger medicine may be used if initial measures are ineffective. Desensitizing injections can be used if medicine and avoidance does not work. Desensitization is when a patient is given ongoing shots until the body becomes less sensitive to the allergen. Make sure you follow up with your health care provider if problems continue. HOME CARE INSTRUCTIONS It is not possible to completely avoid allergens, but you can reduce your symptoms by taking steps to limit your exposure to them. It helps to know exactly what you are allergic to so that you can avoid your specific triggers. SEEK MEDICAL CARE IF:  You have a fever.  You develop a cough that does not stop easily  (persistent).  You have shortness of breath.  You start wheezing.  Symptoms interfere with normal daily activities.   This information is not intended to replace advice given to you by your health care provider. Make sure you discuss any questions you have with your health care provider.   Document Released: 06/14/2001 Document Revised: 10/10/2014 Document Reviewed: 05/27/2013 Elsevier Interactive Patient Education 2016 Elsevier Inc.  Bronchospasm, Adult A bronchospasm is when the tubes that carry air in and out of your lungs (airways) spasm or tighten. During a bronchospasm it is hard to breathe. This is because the airways get smaller. A bronchospasm can be triggered by:  Allergies. These may be to animals,  pollen, food, or mold.  Infection. This is a common cause of bronchospasm.  Exercise.  Irritants. These include pollution, cigarette smoke, strong odors, aerosol sprays, and paint fumes.  Weather changes.  Stress.  Being emotional. HOME CARE   Always have a plan for getting help. Know when to call your doctor and local emergency services (911 in the [place] Know where you can get emergency care.  Only take medicines as told by your doctor.  If you were prescribed an inhaler or nebulizer machine, ask your doctor how to use it correctly. Always use a spacer with your inhaler if you were given one.  Stay calm during an attack. Try to relax and breathe more slowly.  Control your home environment:  Change your heating and air conditioning filter at least once a month.  Limit your use of fireplaces and wood stoves.  Do not  smoke. Do not  allow smoking in your home.  Avoid perfumes and fragrances.  Get rid of pests (such as roaches and mice) and their droppings.  Throw away plants if you see mold on them.  Keep your house clean and dust free.  Replace carpet with wood, tile, or vinyl flooring. Carpet can trap dander and dust.  Use allergy-proof pillows,  mattress covers, and box spring covers.  Wash bed sheets and blankets every week in hot water. Dry them in a dryer.  Use blankets that are made of polyester or cotton.  Wash hands frequently. GET HELP IF:  You have muscle aches.  You have chest pain.  The thick spit you spit or cough up (sputum) changes from clear or white to yellow, green, gray, or bloody.  The thick spit you spit or cough up gets thicker.  There are problems that may be related to the medicine you are given such as:  A rash.  Itching.  Swelling.  Trouble breathing. GET HELP RIGHT AWAY IF:  You feel you cannot breathe or catch your breath.  You cannot stop coughing.  Your treatment is not helping you breathe better.  You have very bad chest pain. MAKE SURE YOU:   Understand these instructions.  Will watch your condition.  Will get help right away if you are not doing well or get worse.   This information is not intended to replace advice given to you by your health care provider. Make sure you discuss any questions you have with your health care provider.   Document Released: 07/17/2009 Document Revised: 10/10/2014 Document Reviewed: 03/12/2013 Elsevier Interactive Patient Education [employer]2016 Elsevier Inc.      I personally performed the services described in this documentation, which was scribed in my presence. The recorded information has been reviewed and considered, and addended by me as needed.

## 2016-01-14 NOTE — Patient Instructions (Addendum)
IF you received an x-ray today, you will receive an invoice from [facility]. Please contact [facility] at [phone] with questions or concerns regarding your invoice.   IF you received labwork today, you will receive an invoice from [employer]. Please contact [facility] at [phone] with questions or concerns regarding your invoice.   Our billing staff will not be able to assist you with questions regarding bills from these companies.  You will be contacted with the lab results as soon as they are available. The fastest way to get your results is to activate your My Chart account. Instructions are located on the last page of this paperwork. If you have not heard from [place] regarding the results in 2 weeks, please contact this office.     Your symptoms appear to be due to allergies and possible asthma or reactive airway. For allergies, continue an antihistamine such as your tech, Claritin, or Allegra 1 pill once per day, as well as Flonase 2 sprays per nostril each day. I did send in some of this to your pharmacy.  For the wheezing, albuterol if only needed when wheezing.  If you are having to use albuterol more than twice per week, or any nighttime wheezing symptoms, start the Qvar 1 puff inhaled twice per day, and follow-up with me in the next 2-3 months, sooner if any worsening of symptoms.  Return to the clinic or go to the nearest emergency room if any of your symptoms worsen or new symptoms occur.  Allergic Rhinitis Allergic rhinitis is when the mucous membranes in the nose respond to allergens. Allergens are particles in the air that cause your body to have an allergic reaction. This causes you to release allergic antibodies. Through a chain of events, these eventually cause you to release histamine into the blood stream. Although meant to protect the body, it is this release of histamine that causes your discomfort, such as frequent  sneezing, congestion, and an itchy, runny nose.  CAUSES Seasonal allergic rhinitis (hay fever) is caused by pollen allergens that may come from grasses, trees, and weeds. Year-round allergic rhinitis (perennial allergic rhinitis) is caused by allergens such as house dust mites, pet dander, and mold spores. SYMPTOMS  Nasal stuffiness (congestion).  Itchy, runny nose with sneezing and tearing of the eyes. DIAGNOSIS Your health care provider can help you determine the allergen or allergens that trigger your symptoms. If you and your health care provider are unable to determine the allergen, skin or blood testing may be used. Your health care provider will diagnose your condition after taking your health history and performing a physical exam. Your health care provider may assess you for other related conditions, such as asthma, pink eye, or an ear infection. TREATMENT Allergic rhinitis does not have a cure, but it can be controlled by:  Medicines that block allergy symptoms. These may include allergy shots, nasal sprays, and oral antihistamines.  Avoiding the allergen. Hay fever may often be treated with antihistamines in pill or nasal spray forms. Antihistamines block the effects of histamine. There are over-the-counter medicines that may help with nasal congestion and swelling around the eyes. Check with your health care provider before taking or giving this medicine. If avoiding the allergen or the medicine prescribed do not work, there are many new medicines your health care provider can prescribe. Stronger medicine may be used if initial measures are ineffective. Desensitizing injections can be used if medicine and avoidance does not work.  Desensitization is when a patient is given ongoing shots until the body becomes less sensitive to the allergen. Make sure you follow up with your health care provider if problems continue. HOME CARE INSTRUCTIONS It is not possible to completely avoid  allergens, but you can reduce your symptoms by taking steps to limit your exposure to them. It helps to know exactly what you are allergic to so that you can avoid your specific triggers. SEEK MEDICAL CARE IF:  You have a fever.  You develop a cough that does not stop easily (persistent).  You have shortness of breath.  You start wheezing.  Symptoms interfere with normal daily activities.   This information is not intended to replace advice given to you by your health care provider. Make sure you discuss any questions you have with your health care provider.   Document Released: 06/14/2001 Document Revised: 10/10/2014 Document Reviewed: 05/27/2013 Elsevier Interactive Patient Education 2016 Elsevier Inc.  Bronchospasm, Adult A bronchospasm is when the tubes that carry air in and out of your lungs (airways) spasm or tighten. During a bronchospasm it is hard to breathe. This is because the airways get smaller. A bronchospasm can be triggered by:  Allergies. These may be to animals, pollen, food, or mold.  Infection. This is a common cause of bronchospasm.  Exercise.  Irritants. These include pollution, cigarette smoke, strong odors, aerosol sprays, and paint fumes.  Weather changes.  Stress.  Being emotional. HOME CARE   Always have a plan for getting help. Know when to call your doctor and local emergency services (911 in the [place] Know where you can get emergency care.  Only take medicines as told by your doctor.  If you were prescribed an inhaler or nebulizer machine, ask your doctor how to use it correctly. Always use a spacer with your inhaler if you were given one.  Stay calm during an attack. Try to relax and breathe more slowly.  Control your home environment:  Change your heating and air conditioning filter at least once a month.  Limit your use of fireplaces and wood stoves.  Do not  smoke. Do not  allow smoking in your home.  Avoid perfumes and  fragrances.  Get rid of pests (such as roaches and mice) and their droppings.  Throw away plants if you see mold on them.  Keep your house clean and dust free.  Replace carpet with wood, tile, or vinyl flooring. Carpet can trap dander and dust.  Use allergy-proof pillows, mattress covers, and box spring covers.  Wash bed sheets and blankets every week in hot water. Dry them in a dryer.  Use blankets that are made of polyester or cotton.  Wash hands frequently. GET HELP IF:  You have muscle aches.  You have chest pain.  The thick spit you spit or cough up (sputum) changes from clear or white to yellow, green, gray, or bloody.  The thick spit you spit or cough up gets thicker.  There are problems that may be related to the medicine you are given such as:  A rash.  Itching.  Swelling.  Trouble breathing. GET HELP RIGHT AWAY IF:  You feel you cannot breathe or catch your breath.  You cannot stop coughing.  Your treatment is not helping you breathe better.  You have very bad chest pain. MAKE SURE YOU:   Understand these instructions.  Will watch your condition.  Will get help right away if you are not doing well or get worse.  This information is not intended to replace advice given to you by your health care provider. Make sure you discuss any questions you have with your health care provider.   Document Released: 07/17/2009 Document Revised: 10/10/2014 Document Reviewed: 03/12/2013 Elsevier Interactive Patient Education [employer]2016 Elsevier Inc.

## 2016-11-13 ENCOUNTER — Encounter (HOSPITAL_COMMUNITY): Payer: Self-pay | Admitting: Emergency Medicine

## 2016-11-13 ENCOUNTER — Ambulatory Visit (HOSPITAL_COMMUNITY)
Admission: EM | Admit: 2016-11-13 | Discharge: 2016-11-13 | Disposition: A | Discharge status: Home / Self Care | Payer: Self-pay | Attending: Internal Medicine | Admitting: Internal Medicine

## 2016-11-13 DIAGNOSIS — M549 Dorsalgia, unspecified: Secondary | ICD-10-CM

## 2016-11-13 NOTE — Discharge Instructions (Signed)
I am glad you are improving which is reassuring. Your lung exam is good. I suspect that you either irritated a muscle or the nerve bundle in that area. Certainly if this worsens then please follow up. May use ibuprofen 800mg  every 8 hours for inflammation which should help. Feel better.

## 2016-11-13 NOTE — ED Triage Notes (Signed)
[facility] with a complaint of back pain over his right shoulder blade x 3 days. The patient denied any injury.

## 2016-11-13 NOTE — ED Provider Notes (Signed)
CSN: [id number]     Arrival date & time 11/13/16  1245 History   First MD Initiated Contact with Patient 11/13/16 1431     Chief Complaint  Patient presents with  . Back Pain   (Consider location/radiation/quality/duration/timing/severity/associated sxs/prior Treatment) 26 yo black male presents with a 3 day history of right upper back pain, "just below my shoulder blade". No known injury, but patient reports that after work on Thursday he would feel a strain with lifting objects with the right arm. He would also feel with reaching. It is improving but reports that when he sneezed today he felt some of the pain again and wanted it checked out. He denies cough, dyspnea or chest pain. See remainder of ROS.       History reviewed. No pertinent past medical history. History reviewed. No pertinent surgical history. History reviewed. No pertinent family history. Social History  Substance Use Topics  . Smoking status: Never Smoker  . Smokeless tobacco: Not on file  . Alcohol use Not on file    Review of Systems  Constitutional: Negative for chills, fatigue and fever.  HENT: Negative.   Respiratory: Negative for cough, shortness of breath and wheezing.   Cardiovascular: Negative for chest pain.  Musculoskeletal: Positive for back pain.  Skin: Negative for rash.  Neurological: Negative for weakness and numbness.    Allergies  Patient has no known allergies.  Home Medications   Prior to Admission medications   Not on File   Meds Ordered and Administered this Visit  Medications - No data to display  BP 134/83 (BP Location: Right Arm)   Pulse 65   Temp 98.4 F (36.9 C) (Oral)   Resp 16   SpO2 100%  No data found.   Physical Exam  Constitutional: He is oriented to person, place, and time. He appears well-developed and well-nourished. No distress.  Moving normally in the room as I witnessed him getting onto table  Cardiovascular: Normal rate.   Pulmonary/Chest: Effort  normal and breath sounds normal. He has no wheezes. He exhibits no tenderness.  Musculoskeletal: Normal range of motion.  Non-reproducible pain to palpation along the right para spinal muscles or scapula region, spine is non-tender. Full ROM of the cervical spine and shoulder. No reproducible pain is noted.   Neurological: He is alert and oriented to person, place, and time. He exhibits normal muscle tone.  Motor 5/5 RUE  Skin: Skin is warm and dry. No rash noted. He is not diaphoretic.  Psychiatric: His behavior is normal.  Nursing note and vitals reviewed.   Urgent Care Course     Procedures (including critical care time)  Labs Review Labs Reviewed - No data to display  Imaging Review No results found.   Visual Acuity Review  Right Eye Distance:   Left Eye Distance:   Bilateral Distance:    Right Eye Near:   Left Eye Near:    Bilateral Near:         MDM   1. Upper back pain on right side    Suspect Musculoskelatal etiology given his presentation of ROM reproducing his pain. No emergent findings and his exam is normal. He is also improving. Treat symptomatically with Ibuprofen. If he worsens then please return for further w/u.     Riki SheerMichelle G Young, PA-C 11/13/16 1458

## 2016-11-18 ENCOUNTER — Ambulatory Visit: Payer: BLUE CROSS/BLUE SHIELD | Admitting: Physician Assistant

## 2016-11-18 VITALS — BP 118/70 | HR 78 | Temp 98.5°F | Resp 16 | Ht 67.0 in | Wt 159.0 lb

## 2016-11-18 DIAGNOSIS — R05 Cough: Secondary | ICD-10-CM

## 2016-11-18 DIAGNOSIS — R062 Wheezing: Secondary | ICD-10-CM

## 2016-11-18 DIAGNOSIS — R059 Cough, unspecified: Secondary | ICD-10-CM

## 2016-11-18 MED ORDER — IPRATROPIUM BROMIDE 0.02 % IN SOLN
0.5000 mg | Freq: Once | RESPIRATORY_TRACT | Status: AC
  Administered 2016-11-18: 0.5 mg via RESPIRATORY_TRACT

## 2016-11-18 MED ORDER — HYDROCODONE-HOMATROPINE 5-1.5 MG/5ML PO SYRP: 2.5000 mL | ORAL_SOLUTION | Freq: Every day | ORAL | 0 refills | Status: AC

## 2016-11-18 MED ORDER — ALBUTEROL SULFATE (2.5 MG/3ML) 0.083% IN NEBU: 2.5000 mg | INHALATION_SOLUTION | Freq: Four times a day (QID) | RESPIRATORY_TRACT | 1 refills | Status: DC | PRN

## 2016-11-18 MED ORDER — NAPROXEN 500 MG PO TABS: 500.0000 mg | ORAL_TABLET | Freq: Two times a day (BID) | ORAL | 0 refills | Status: DC

## 2016-11-18 MED ORDER — ALBUTEROL SULFATE (2.5 MG/3ML) 0.083% IN NEBU
2.5000 mg | INHALATION_SOLUTION | Freq: Once | RESPIRATORY_TRACT | Status: AC
  Administered 2016-11-18: 2.5 mg via RESPIRATORY_TRACT

## 2016-11-18 NOTE — Patient Instructions (Signed)
     IF you received an x-ray today, you will receive an invoice from [facility]. Please contact [facility] at [phone] with questions or concerns regarding your invoice.   IF you received labwork today, you will receive an invoice from [facility]. Please contact [facility] at [phone] with questions or concerns regarding your invoice.   Our billing staff will not be able to assist you with questions regarding bills from these companies.  You will be contacted with the lab results as soon as they are available. The fastest way to get your results is to activate your My Chart account. Instructions are located on the last page of this paperwork. If you have not heard from us regarding the results in 2 weeks, please contact this office.     

## 2016-11-18 NOTE — Progress Notes (Signed)
11/21/2016 10:48 AM   DOB: 1991-08-27 / MRN: [id number]  SUBJECTIVE:  Henry Hall is a 26 y.o. male presenting for cough productive of greene sputum time 3 days. Feels that mucus is in his throat.  Associates wheezing at night and coughing is also worse at night.  Has tried some OTC cold meds. Can't sleep due to cough. He denies a history of asthma.   He has No Known Allergies.   He  has a past medical history of Allergy.    He  reports that he has never smoked. He has never used smokeless tobacco. He reports that he does not drink alcohol or use drugs. He  has no sexual activity history on file. The patient  has no past surgical history on file.  His family history is not on file.  Review of Systems  Constitutional: Negative for chills and fever.  HENT: Positive for congestion and sore throat.   Respiratory: Positive for cough, sputum production and wheezing. Negative for hemoptysis.   Cardiovascular: Negative for chest pain.  Skin: Negative for rash.  Neurological: Negative for dizziness.    The problem list and medications were reviewed and updated by myself where necessary and exist elsewhere in the encounter.   OBJECTIVE:  BP 118/70 (BP Location: Right Arm, Patient Position: Sitting, Cuff Size: Normal)   Pulse 78   Temp 98.5 F (36.9 C) (Oral)   Resp 16   Ht 5\' 7"  (1.702 m)   Wt 159 lb (72.1 kg)   SpO2 98%   BMI 24.90 kg/m   Physical Exam  Constitutional: He is oriented to person, place, and time. He appears well-developed and well-nourished.  HENT:  Right Ear: External ear normal.  Cardiovascular: Normal rate and regular rhythm.   Pulmonary/Chest: Effort normal. No respiratory distress. He has wheezes (peak flow 350 pre neb to 475 post neb). He has no rales. He exhibits no tenderness.  Abdominal: Soft. Bowel sounds are normal.  Neurological: He is alert and oriented to person, place, and time.  Skin: Skin is warm and dry.    No results found for this or any  previous visit (from the past 72 hour(s)).  No results found.  ASSESSMENT AND PLAN:  Henry Hall was seen today for cough.  Diagnoses and all orders for this visit:  Cough: Most likely virally induced.  Will treat symptomatically with albuterol inhaler and cough syrup.  RTC as needed.  -     HYDROcodone-homatropine (HYCODAN) 5-1.5 MG/5ML syrup; Take 2.5-5 mLs by mouth at bedtime. -     Care order/instruction: -     naproxen (NAPROSYN) 500 MG tablet; Take 1 tablet (500 mg total) by mouth 2 (two) times daily with a meal.  Wheezes -     albuterol (PROVENTIL) (2.5 MG/3ML) 0.083% nebulizer solution 2.5 mg; Take 3 mLs (2.5 mg total) by nebulization once. -     ipratropium (ATROVENT) nebulizer solution 0.5 mg; Take 2.5 mLs (0.5 mg total) by nebulization once.  Other orders -     Discontinue: albuterol (PROVENTIL) (2.5 MG/3ML) 0.083% nebulizer solution; Take 3 mLs (2.5 mg total) by nebulization every 6 (six) hours as needed for wheezing or shortness of breath.    The patient is advised to call or return to clinic if he does not see an improvement in symptoms, or to seek the care of the closest emergency department if he worsens with the above plan.   Deliah Boston, MHS, PA-C [facility]  11/21/2016 10:48 AM

## 2016-11-19 ENCOUNTER — Other Ambulatory Visit: Payer: Self-pay | Admitting: Family Medicine

## 2016-11-19 ENCOUNTER — Telehealth: Payer: Self-pay

## 2016-11-19 MED ORDER — ALBUTEROL SULFATE HFA 108 (90 BASE) MCG/ACT IN AERS: 2.0000 | INHALATION_SPRAY | RESPIRATORY_TRACT | 1 refills | Status: DC | PRN

## 2016-11-19 NOTE — Progress Notes (Unsigned)
Ordered albuterol inhaler. Patient doesn't have a nebulizer.

## 2016-11-19 NOTE — Telephone Encounter (Signed)
Looks like neb solution called in not inhaler? Called and no v/m

## 2016-11-19 NOTE — Telephone Encounter (Signed)
Pt is needing talk with someone about getting the rx for the inhaler that was supposed to be called in yesterday   Best number [phone]

## 2016-11-19 NOTE — Telephone Encounter (Signed)
Confirmed with pt was supposed to get inhaler, he does not have a neb. If ok please send to pharmacy

## 2016-11-24 ENCOUNTER — Ambulatory Visit (INDEPENDENT_AMBULATORY_CARE_PROVIDER_SITE_OTHER): Payer: BLUE CROSS/BLUE SHIELD

## 2016-11-24 ENCOUNTER — Ambulatory Visit (INDEPENDENT_AMBULATORY_CARE_PROVIDER_SITE_OTHER): Payer: BLUE CROSS/BLUE SHIELD | Admitting: Physician Assistant

## 2016-11-24 VITALS — BP 140/82 | HR 71 | Temp 98.6°F | Resp 18 | Ht 67.0 in | Wt 162.2 lb

## 2016-11-24 DIAGNOSIS — R05 Cough: Secondary | ICD-10-CM | POA: Diagnosis not present

## 2016-11-24 DIAGNOSIS — R059 Cough, unspecified: Secondary | ICD-10-CM

## 2016-11-24 MED ORDER — HYDROCODONE-HOMATROPINE 5-1.5 MG/5ML PO SYRP: 2.5000 mL | ORAL_SOLUTION | Freq: Every day | ORAL | 0 refills | Status: AC

## 2016-11-24 NOTE — Patient Instructions (Signed)
     IF you received an x-ray today, you will receive an invoice from [facility]. Please contact [facility] at [phone] with questions or concerns regarding your invoice.   IF you received labwork today, you will receive an invoice from [facility]. Please contact [facility] at [phone] with questions or concerns regarding your invoice.   Our billing staff will not be able to assist you with questions regarding bills from these companies.  You will be contacted with the lab results as soon as they are available. The fastest way to get your results is to activate your My Chart account. Instructions are located on the last page of this paperwork. If you have not heard from us regarding the results in 2 weeks, please contact this office.     

## 2016-11-24 NOTE — Progress Notes (Signed)
  11/28/2016 2:01 PM   DOB: 06/20/1991 / MRN: [id number]  SUBJECTIVE:  Henry Hall is a 26 y.o. male presenting for continuation of cough. I saw him 7 days ago and his cough was improved with albuterol.  His exam was negative at that time so I prescribed him albuterol along with naprosyn and hycodan. He is back today with concern of pneumonia. He did see some blood in his cough this morning and tells me that yesterday he had some SOB with working and did not have his inhaler, which scared him. He continues to deny a history of asthma.   He has No Known Allergies.   He  has a past medical history of Allergy.    He  reports that he has never smoked. He has never used smokeless tobacco. He reports that he does not drink alcohol or use drugs. He  has no sexual activity history on file. The patient  has no past surgical history on file.  His family history is not on file.  Review of Systems  Constitutional: Negative for fever.  Respiratory: Positive for cough, hemoptysis, sputum production and wheezing. Negative for shortness of breath.   Cardiovascular: Negative for chest pain.  Gastrointestinal: Negative for nausea.  Neurological: Negative for dizziness.    The problem list and medications were reviewed and updated by myself where necessary and exist elsewhere in the encounter.   OBJECTIVE:  BP 140/82   Pulse 71   Temp 98.6 F (37 C) (Oral)   Resp 18   Ht 5\' 7"  (1.702 m)   Wt 162 lb 3.2 oz (73.6 kg)   SpO2 99%   BMI 25.40 kg/m   Physical Exam  Constitutional: He is oriented to person, place, and time.  Cardiovascular: Normal rate, regular rhythm and normal heart sounds.  Exam reveals no gallop, no friction rub and no decreased pulses.   No murmur heard. Pulmonary/Chest: Effort normal and breath sounds normal. No respiratory distress. He has no decreased breath sounds. He has no wheezes. He has no rhonchi. He has no rales.  Musculoskeletal: He exhibits no edema.  Neurological: He  is alert and oriented to person, place, and time.  Skin: Skin is warm and dry. He is not diaphoretic.    No results found for this or any previous visit (from the past 72 hour(s)).  No results found.  ASSESSMENT AND PLAN:  [name] was seen today for follow-up.  Diagnoses and all orders for this visit:  Cough: Possibly asthma, however he has never needed these treatments.  I think this is likely a one off and virally induced bronchospasm. He will stay the course for now and come back in one month for breathing studies.  Comments: Rads clear.  He will return in one month for breathing studies.   Orders: -     DG Chest 2 View; Future -     HYDROcodone-homatropine (HYCODAN) 5-1.5 MG/5ML syrup; Take 2.5-5 mLs by mouth at bedtime.    The patient is advised to call or return to clinic if he does not see an improvement in symptoms, or to seek the care of the closest emergency department if he worsens with the above plan.   Deliah BostonMichael Clark, MHS, PA-C [facility] 11/28/2016 2:01 PM

## 2017-01-08 ENCOUNTER — Emergency Department (HOSPITAL_COMMUNITY)
Admission: EM | Admit: 2017-01-08 | Discharge: 2017-01-08 | Disposition: A | Discharge status: Home / Self Care | Payer: BLUE CROSS/BLUE SHIELD | Attending: Emergency Medicine | Admitting: Emergency Medicine

## 2017-01-08 ENCOUNTER — Emergency Department (HOSPITAL_COMMUNITY): Payer: BLUE CROSS/BLUE SHIELD

## 2017-01-08 ENCOUNTER — Encounter (HOSPITAL_COMMUNITY): Payer: Self-pay | Admitting: Emergency Medicine

## 2017-01-08 DIAGNOSIS — J4521 Mild intermittent asthma with (acute) exacerbation: Secondary | ICD-10-CM | POA: Insufficient documentation

## 2017-01-08 MED ORDER — LORATADINE-PSEUDOEPHEDRINE ER 5-120 MG PO TB12: 1.0000 | ORAL_TABLET | Freq: Two times a day (BID) | ORAL | 0 refills | Status: DC

## 2017-01-08 MED ORDER — IPRATROPIUM BROMIDE 0.06 % NA SOLN: 2.0000 | Freq: Four times a day (QID) | NASAL | 12 refills | Status: DC

## 2017-01-08 MED ORDER — PREDNISONE 10 MG PO TABS: 40.0000 mg | ORAL_TABLET | Freq: Every day | ORAL | 0 refills | Status: AC

## 2017-01-08 MED ORDER — PREDNISONE 20 MG PO TABS
20.0000 mg | ORAL_TABLET | Freq: Once | ORAL | Status: AC
  Administered 2017-01-08: 20 mg via ORAL
  Filled 2017-01-08: qty 1

## 2017-01-08 MED ORDER — ALBUTEROL SULFATE HFA 108 (90 BASE) MCG/ACT IN AERS: 1.0000 | INHALATION_SPRAY | Freq: Four times a day (QID) | RESPIRATORY_TRACT | 0 refills | Status: DC | PRN

## 2017-01-08 MED ORDER — IPRATROPIUM-ALBUTEROL 0.5-2.5 (3) MG/3ML IN SOLN
3.0000 mL | Freq: Once | RESPIRATORY_TRACT | Status: AC
  Administered 2017-01-08: 3 mL via RESPIRATORY_TRACT
  Filled 2017-01-08: qty 3

## 2017-01-08 NOTE — Discharge Instructions (Signed)
Please return to the emergency department with new or worsening symptoms. Please follow-up with your primary care provider if symptoms persist.

## 2017-01-08 NOTE — ED Triage Notes (Signed)
Pt reports coughing and congestion with green mucous since yesterday. Pt reports difficulty breathing during the cough attacks, no shortness of breath currently. Pt reports taking mucinex but he could not sleep due to the coughing last night.

## 2017-01-08 NOTE — ED Triage Notes (Signed)
PT reports he ran out of his inhaler on SAt.

## 2017-01-08 NOTE — ED Provider Notes (Signed)
[facility]-EMERGENCY DEPT Provider Note   CSN: [id number] Arrival date & time: 01/08/17  1036    By signing my name below, I, Henry Hall, attest that this documentation has been prepared under the direction and in the presence of Henry McDonald, PA-C. Electronically Signed: Valentino Hall, ED Scribe. 01/08/17. 1:51 PM.  History   Chief Complaint Chief Complaint  Patient presents with  . URI  . Cough   The history is provided by the patient. No language interpreter was used.  Cough  Associated symptoms include sore throat and wheezing. Pertinent negatives include no shortness of breath.   HPI Comments: Henry Hall is a 26 y.o. male with PMHx of allergy who presents to the Emergency Department complaining of 3/10, gradual onset, persistent, wheezing accompanied by chest pain that occurred yesterday. Pt describes his chest pain as a tightness sensation. He reports having sleep disturbance secondary to his wheezing. He also reports having a productive cough with a "slimy greenish-yellow" sputum followed by a sore throat and congestion. He reports taking Mucinex at home with minimal relief. Per pt, he denies a h/o of asthma. He does have a h/o of allergies and states albuterol inhalers usually provide significant relief. Pt denies abdominal pain, nausea, vomiting, diarrhea, SOB. Pt is not a smoker.   Past Medical History:  Diagnosis Date  . Allergy     There are no active problems to display for this patient.   No past surgical history on file.     Home Medications    Prior to Admission medications   Medication Sig Start Date End Date Taking? Authorizing Provider  albuterol (PROVENTIL HFA;VENTOLIN HFA) 108 (90 Base) MCG/ACT inhaler Inhale 1-2 puffs into the lungs every 6 (six) hours as needed for wheezing or shortness of breath. 01/08/17   Henry A McDonald, PA-C  ipratropium (ATROVENT) 0.06 % nasal spray Place 2 sprays into both nostrils 4 (four) times daily. 01/08/17   Henry A McDonald,  PA-C  loratadine-pseudoephedrine (CLARITIN-D 12 HOUR) 5-120 MG tablet Take 1 tablet by mouth 2 (two) times daily. 01/08/17   Henry A McDonald, PA-C  naproxen (NAPROSYN) 500 MG tablet Take 1 tablet (500 mg total) by mouth 2 (two) times daily with a meal. 11/18/16   Henry Neas, PA-C  predniSONE (DELTASONE) 10 MG tablet Take 4 tablets (40 mg total) by mouth daily. 01/08/17 01/12/17  Henry A McDonald, PA-C    Family History No family history on file.  Social History Social History  Substance Use Topics  . Smoking status: Never Smoker  . Smokeless tobacco: Never Used  . Alcohol use No     Allergies   Patient has no known allergies.   Review of Systems Review of Systems  HENT: Positive for congestion and sore throat.   Respiratory: Positive for cough and wheezing. Negative for shortness of breath.   Gastrointestinal: Negative for abdominal pain, diarrhea, nausea and vomiting.     Physical Exam Updated Vital Signs BP 117/77 (BP Location: Left Arm)   Pulse 96   Temp 99 F (37.2 C) (Oral)   Resp 18   SpO2 98%   Physical Exam  Constitutional: He appears well-developed and well-nourished.  HENT:  Head: Normocephalic and atraumatic.  Rhinorrhea in nose.   Eyes: Conjunctivae are normal.  Neck: Neck supple.  Cardiovascular: Normal rate, regular rhythm and normal heart sounds.   No murmur heard. Pulmonary/Chest: Effort normal. No respiratory distress. He has wheezes. He has no rales.  Scattered wheezes throughout.   Abdominal:  Soft. He exhibits no distension. There is no tenderness. There is no guarding.  Musculoskeletal: He exhibits no edema.  Neurological: He is alert.  Skin: Skin is warm and dry.  Psychiatric: His behavior is normal.  Nursing note and vitals reviewed.    ED Treatments / Results   DIAGNOSTIC STUDIES: Oxygen Saturation is 99% on RA, normal by my interpretation.    COORDINATION OF CARE: 1:44 PM Discussed treatment plan with pt at bedside which includes  chest imaging, breathing treatments and steroids and pt agreed to plan.   Labs (all labs ordered are listed, but only abnormal results are displayed) Labs Reviewed - No data to display  EKG  EKG Interpretation None       Radiology Dg Chest 2 View  Result Date: 01/08/2017 CLINICAL DATA:  Congestion and cough. EXAM: CHEST  2 VIEW COMPARISON:  November 24, 2016 FINDINGS: The heart size and mediastinal contours are within normal limits. Both lungs are clear. The visualized skeletal structures are unremarkable. IMPRESSION: No active cardiopulmonary disease. Electronically Signed   By: Gerome Sam III M.D   On: 01/08/2017 15:21    Procedures Procedures (including critical care time)  Medications Ordered in ED Medications  ipratropium-albuterol (DUONEB) 0.5-2.5 (3) MG/3ML nebulizer solution 3 mL (3 mLs Nebulization Given 01/08/17 1408)  predniSONE (DELTASONE) tablet 20 mg (20 mg Oral Given 01/08/17 1407)     Initial Impression / Assessment and Plan / ED Course  I have reviewed the triage vital signs and the nursing notes.  Pertinent labs & imaging results that were available during my care of the patient were reviewed by me and considered in my medical decision making (see chart for details).     Pt presenting with c/o cough which has been ongoing over the past week after resolution of cold symptoms.  No ongoing fever.   Patient is overall nontoxic and well hydrated in appearance.  CXR is negative and labs/EKG are reassuring.  Pt given albuterol MDI and cough syrup for symptoms.  Discharged with strict return precautions.  Pt agreeable with plan.   Final Clinical Impressions(s) / ED Diagnoses   Final diagnoses:  Mild intermittent reactive airway disease with acute exacerbation    New Prescriptions Discharge Medication List as of 01/08/2017  3:42 PM    START taking these medications   Details  ipratropium (ATROVENT) 0.06 % nasal spray Place 2 sprays into both nostrils 4 (four)  times daily., Starting Sun 01/08/2017, Print    loratadine-pseudoephedrine (CLARITIN-D 12 HOUR) 5-120 MG tablet Take 1 tablet by mouth 2 (two) times daily., Starting Sun 01/08/2017, Print    predniSONE (DELTASONE) 10 MG tablet Take 4 tablets (40 mg total) by mouth daily., Starting Sun 01/08/2017, Until Thu 01/12/2017, Print        I personally performed the services described in this documentation, which was scribed in my presence. The recorded information has been reviewed and is accurate.      Jerelyn Scott, MD 01/08/17 [phone]

## 2017-01-08 NOTE — ED Notes (Signed)
Declined W/C at D/C and was escorted to lobby by RN. 

## 2017-11-02 ENCOUNTER — Encounter: Payer: Self-pay | Admitting: Physician Assistant

## 2017-11-02 ENCOUNTER — Ambulatory Visit (INDEPENDENT_AMBULATORY_CARE_PROVIDER_SITE_OTHER): Payer: BLUE CROSS/BLUE SHIELD | Admitting: Physician Assistant

## 2017-11-02 VITALS — BP 146/78 | HR 76 | Temp 98.2°F | Resp 18 | Ht 67.0 in | Wt 167.0 lb

## 2017-11-02 DIAGNOSIS — R0602 Shortness of breath: Secondary | ICD-10-CM

## 2017-11-02 DIAGNOSIS — J069 Acute upper respiratory infection, unspecified: Secondary | ICD-10-CM

## 2017-11-02 DIAGNOSIS — R0789 Other chest pain: Secondary | ICD-10-CM | POA: Diagnosis not present

## 2017-11-02 DIAGNOSIS — R0981 Nasal congestion: Secondary | ICD-10-CM | POA: Diagnosis not present

## 2017-11-02 LAB — POC INFLUENZA A&B (BINAX/QUICKVUE)
Influenza A, POC: NEGATIVE
Influenza B, POC: NEGATIVE

## 2017-11-02 MED ORDER — LORATADINE-PSEUDOEPHEDRINE ER 10-240 MG PO TB24: 1.0000 | ORAL_TABLET | Freq: Every day | ORAL | 0 refills | Status: DC

## 2017-11-02 MED ORDER — ALBUTEROL SULFATE HFA 108 (90 BASE) MCG/ACT IN AERS: 1.0000 | INHALATION_SPRAY | Freq: Four times a day (QID) | RESPIRATORY_TRACT | 0 refills | Status: DC | PRN

## 2017-11-02 NOTE — Progress Notes (Signed)
Henry Hall  MRN: [id number] DOB: 02-21-1991  PCP: Patient, No Pcp Per  Subjective:  Pt is a 27 year old male who presents to clinic for nasal congestion, headache, shob x 1 days. Endorses chest pain. He has not been taking anything to feel better.  ROS below.   Review of Systems  Constitutional: Negative for chills, diaphoresis, fatigue and fever.  HENT: Positive for congestion, rhinorrhea and sinus pressure. Negative for postnasal drip, sinus pain and sore throat.   Respiratory: Positive for shortness of breath. Negative for cough, chest tightness and wheezing.   Cardiovascular: Negative for chest pain and palpitations.  Gastrointestinal: Negative for nausea and vomiting.  Psychiatric/Behavioral: Negative for sleep disturbance.    There are no active problems to display for this patient.   Current Outpatient Medications on File Prior to Visit  Medication Sig Dispense Refill  . albuterol (PROVENTIL HFA;VENTOLIN HFA) 108 (90 Base) MCG/ACT inhaler Inhale 1-2 puffs into the lungs every 6 (six) hours as needed for wheezing or shortness of breath. (Patient not taking: Reported on 11/02/2017) 1 Inhaler 0  . ipratropium (ATROVENT) 0.06 % nasal spray Place 2 sprays into both nostrils 4 (four) times daily. (Patient not taking: Reported on 11/02/2017) 15 mL 12  . loratadine-pseudoephedrine (CLARITIN-D 12 HOUR) 5-120 MG tablet Take 1 tablet by mouth 2 (two) times daily. (Patient not taking: Reported on 11/02/2017) 14 tablet 0  . naproxen (NAPROSYN) 500 MG tablet Take 1 tablet (500 mg total) by mouth 2 (two) times daily with a meal. (Patient not taking: Reported on 11/02/2017) 16 tablet 0   No current facility-administered medications on file prior to visit.    No Known Allergies   Objective:  BP (!) 146/78   Pulse 76   Temp 98.2 F (36.8 C) (Oral)   Resp 18   Ht 5\' 7"  (1.702 m)   Wt 167 lb (75.8 kg)   SpO2 97%   BMI 26.16 kg/m   Physical Exam  Constitutional: He is oriented to  person, place, and time and well-developed, well-nourished, and in no distress. No distress.  HENT:  Right Ear: Tympanic membrane normal.  Left Ear: Tympanic membrane normal.  Nose: Mucosal edema present. No rhinorrhea. Right sinus exhibits no maxillary sinus tenderness and no frontal sinus tenderness. Left sinus exhibits no maxillary sinus tenderness and no frontal sinus tenderness.  Mouth/Throat: Oropharynx is clear and moist and mucous membranes are normal.  Eyes: Right conjunctiva is injected. Left conjunctiva is injected.  Cardiovascular: Normal rate, regular rhythm and normal heart sounds.  Pulmonary/Chest: Effort normal. No respiratory distress. He has wheezes in the left lower field. He has no rhonchi. He has no rales.  Neurological: He is alert and oriented to person, place, and time. GCS score is 15.  Skin: Skin is warm and dry.  Psychiatric: Mood, memory, affect and judgment normal.  Vitals reviewed.  Results for orders placed or performed in visit on 11/02/17  POC Influenza A&B(BINAX/QUICKVUE)  Result Value Ref Range   Influenza A, POC Negative Negative   Influenza B, POC Negative Negative   EKG sinus rhythm, right axis.  Assessment and Plan :  1. Acute upper respiratory infection - pt presents for nasal congestion, headache, shob x 1 days. EKG is wnl. Flu is negative. Will treat supportively with albuterol, mucinex and claritin. RTC in 5-7 days if no improvement.  2. Chest tightness - EKG 12-Lead  3. SOB (shortness of breath) - POC Influenza A&B(BINAX/QUICKVUE) - albuterol (PROVENTIL HFA;VENTOLIN HFA) 108 (90  Base) MCG/ACT inhaler; Inhale 1-2 puffs into the lungs every 6 (six) hours as needed for wheezing or shortness of breath.  Dispense: 1 Inhaler; Refill: 0  4. Nasal congestion - loratadine-pseudoephedrine (CLARITIN-D 24 HOUR) 10-240 MG 24 hr tablet; Take 1 tablet by mouth daily.  Dispense: 30 tablet; Refill: 0    Henry CollieWhitney Seham Gardenhire, PA-C  [facility] 11/02/2017 5:36 PM

## 2017-11-02 NOTE — Patient Instructions (Addendum)
You are negative for the flu.  Start taking: mucinex x 5 days.  atrovent nasal spray twice daily.  claritin-d once daily   Stay well hydrated. Get lost of rest. Wash your hands often. Come back in 5-7 days if you are not better  -Foods that can help speed recovery: honey, garlic, chicken soup, elderberries, green tea.  -Supplements that can help speed recovery: vitamin C, zinc, elderberry extract, quercetin, ginseng, selenium -Supplement with prebiotics and probiotics:   Advil or ibuprofen for pain. Do not take Aspirin.  Drink enough water and fluids to keep your urine clear or pale yellow.  For sore throat: ? Gargle with 8 oz of salt water ( tsp of salt per 1 qt of water) as often as every 1-2 hours to soothe your throat.  Gargle liquid benadryl.  Cepacol throat lozenges (if you are not at risk for choking).  For sore throat try using a honey-based tea. Use 3 teaspoons of honey with juice squeezed from half lemon. Place shaved pieces of ginger into 1/2-1 cup of water and warm over stove top. Then mix the ingredients and repeat every 4 hours as needed.  Cough Syrup Recipe: Sweet Lemon & Honey Thyme  Ingredients a handful of fresh thyme sprigs   1 pint of water (2 cups)  1/2 cup honey (raw is best, but regular will do)  1/2 lemon chopped Instructions 1. Place the lemon in the pint jar and cover with the honey. The honey will macerate the lemons and draw out liquids which taste so delicious! 2. Meanwhile, toss the thyme leaves into a saucepan and cover them with the water. 3. Bring the water to a gentle simmer and reduce it to half, about a cup of tea. 4. When the tea is reduced and cooled a bit, strain the sprigs & leaves, add it into the pint jar and stir it well. 5. Give it a shake and use a spoonful as needed. 6. Store your homemade cough syrup in the refrigerator for about a month.  What causes a cough? In adults, common causes of a cough include: ?An infection of the  airways or lungs (such as the common cold) ?Postnasal drip - Postnasal drip is when mucus from the nose drips down or flows along the back of the throat. Postnasal drip can happen when people have: .A cold .Allergies .A sinus infection - The sinuses are hollow areas in the bones of the face that open into the nose. ?Lung conditions, like asthma and chronic obstructive pulmonary disease (COPD) - Both of these conditions can make it hard to breathe. COPD is usually caused by smoking. ?Acid reflux - Acid reflux is when the acid that is normally in your stomach backs up into your esophagus (the tube that carries food from your mouth to your stomach). ?A side effect from blood pressure medicines called "ACE inhibitors" ?Smoking cigarettes  Is there anything I can do on my own to get rid of my cough? Yes. To help get rid of your cough, you can: ?Use a humidifier in your bedroom ?Use an over-the-counter cough medicine, or suck on cough drops or hard candy ?Stop smoking, if you smoke ?If you have allergies, avoid the things you are allergic to (like pollen, dust, animals, or mold) If you have acid reflux, your doctor or nurse will tell you which lifestyle changes can help reduce symptoms.      IF you received an x-ray today, you will [facility] an [facility].  Please contact [facility] at [phone] with questions or concerns regarding your invoice.   IF you received labwork today, you will receive an invoice from [facility]. Please contact [facility] at [phone] with questions or concerns regarding your invoice.   Our billing staff will not be able to assist you with questions regarding bills from these companies.  You will be contacted with the lab results as soon as they are available. The fastest way to get your results is to activate your My Chart account. Instructions are located on the last page of this paperwork. If you have not heard from [place] regarding the  results in 2 weeks, please contact this office.

## 2017-11-14 ENCOUNTER — Other Ambulatory Visit: Payer: Self-pay

## 2017-11-14 ENCOUNTER — Ambulatory Visit: Payer: BLUE CROSS/BLUE SHIELD | Admitting: Physician Assistant

## 2017-11-14 ENCOUNTER — Encounter: Payer: Self-pay | Admitting: Physician Assistant

## 2017-11-14 VITALS — BP 102/64 | HR 94 | Temp 98.5°F | Resp 17 | Ht 67.0 in | Wt 163.6 lb

## 2017-11-14 DIAGNOSIS — R059 Cough, unspecified: Secondary | ICD-10-CM

## 2017-11-14 DIAGNOSIS — R062 Wheezing: Secondary | ICD-10-CM | POA: Diagnosis not present

## 2017-11-14 DIAGNOSIS — J22 Unspecified acute lower respiratory infection: Secondary | ICD-10-CM | POA: Diagnosis not present

## 2017-11-14 DIAGNOSIS — R05 Cough: Secondary | ICD-10-CM | POA: Diagnosis not present

## 2017-11-14 MED ORDER — PREDNISONE 20 MG PO TABS: 40.0000 mg | ORAL_TABLET | Freq: Every day | ORAL | 0 refills | Status: AC

## 2017-11-14 MED ORDER — ALBUTEROL SULFATE (2.5 MG/3ML) 0.083% IN NEBU
2.5000 mg | INHALATION_SOLUTION | Freq: Once | RESPIRATORY_TRACT | Status: AC
  Administered 2017-11-14: 2.5 mg via RESPIRATORY_TRACT

## 2017-11-14 MED ORDER — IPRATROPIUM BROMIDE 0.02 % IN SOLN
0.5000 mg | Freq: Once | RESPIRATORY_TRACT | Status: AC
  Administered 2017-11-14: 0.5 mg via RESPIRATORY_TRACT

## 2017-11-14 MED ORDER — HYDROCODONE-HOMATROPINE 5-1.5 MG/5ML PO SYRP
5.0000 mL | ORAL_SOLUTION | Freq: Three times a day (TID) | ORAL | 0 refills | Status: DC | PRN
Start: 2017-11-14 — End: 2018-11-05

## 2017-11-14 MED ORDER — AZITHROMYCIN 250 MG PO TABS: ORAL_TABLET | ORAL | 0 refills | Status: DC

## 2017-11-14 NOTE — Progress Notes (Signed)
Henry Hall  MRN: [id number] DOB: [birth year]  PCP: Patient, No Pcp Per  Subjective:  Pt is a 27 year old male PMH allergies who presents to clinic for f/u URI.  He was here 1/31 for the same problem c/o nasal congestion, HA and shob x 1 day. Negative flu. Rx Albuterol and Claritin-D. He is not feeling better.  Today he c/o wheezing, coughing and mucus drainage that is yellowish in color. Pt taking mucinex, inhaler and clartiin D.  Denies fever, chills.    Review of Systems  Constitutional: Positive for fatigue. Negative for diaphoresis and fever.  HENT: Positive for congestion, postnasal drip, rhinorrhea and sinus pressure. Negative for sinus pain and sore throat.   Respiratory: Positive for cough, shortness of breath and wheezing.   Psychiatric/Behavioral: Positive for sleep disturbance.    There are no active problems to display for this patient.   Current Outpatient Medications on File Prior to Visit  Medication Sig Dispense Refill  . albuterol (PROVENTIL HFA;VENTOLIN HFA) 108 (90 Base) MCG/ACT inhaler Inhale 1-2 puffs into the lungs every 6 (six) hours as needed for wheezing or shortness of breath. 1 Inhaler 0  . loratadine-pseudoephedrine (CLARITIN-D 24 HOUR) 10-240 MG 24 hr tablet Take 1 tablet by mouth daily. 30 tablet 0  . ipratropium (ATROVENT) 0.06 % nasal spray Place 2 sprays into both nostrils 4 (four) times daily. (Patient not taking: Reported on 11/02/2017) 15 mL 12   No current facility-administered medications on file prior to visit.     No Known Allergies   Objective:  BP 102/64 (BP Location: Left Arm, Patient Position: Sitting, Cuff Size: Normal)   Pulse 94   Temp 98.5 F (36.9 C) (Oral)   Resp 17   Ht 5\' 7"  (1.702 m)   Wt 163 lb 9.6 oz (74.2 kg)   SpO2 99%   BMI 25.62 kg/m   Physical Exam  Constitutional: He is oriented to person, place, and time and well-developed, well-nourished, and in no distress. No distress.  HENT:  Right Ear: Tympanic  membrane normal.  Left Ear: Tympanic membrane normal.  Nose: Mucosal edema present. No rhinorrhea. Right sinus exhibits no maxillary sinus tenderness and no frontal sinus tenderness. Left sinus exhibits no maxillary sinus tenderness and no frontal sinus tenderness.  Mouth/Throat: Oropharynx is clear and moist and mucous membranes are normal.  Cardiovascular: Normal rate, regular rhythm and normal heart sounds.  Pulmonary/Chest: Effort normal. No respiratory distress. He has wheezes in the right middle field, the right lower field, the left middle field and the left lower field. He has no rhonchi. He has no rales.  Neurological: He is alert and oriented to person, place, and time. GCS score is 15.  Skin: Skin is warm and dry.  Psychiatric: Mood, memory, affect and judgment normal.  Vitals reviewed.   Assessment and Plan :  1. Wheezing - albuterol (PROVENTIL) (2.5 MG/3ML) 0.083% nebulizer solution 2.5 mg - ipratropium (ATROVENT) nebulizer solution 0.5 mg - predniSONE (DELTASONE) 20 MG tablet; Take 2 tablets (40 mg total) by mouth daily with breakfast for 5 days.  Dispense: 10 tablet; Refill: 0  2. Lower respiratory infection - azithromycin (ZITHROMAX) 250 MG tablet; Take 2 tabs PO x 1 dose, then 1 tab PO QD x 4 days  Dispense: 6 tablet; Refill: 0  3. Cough - HYDROcodone-homatropine (HYCODAN) 5-1.5 MG/5ML syrup; Take 5 mLs by mouth every 8 (eight) hours as needed for cough.  Dispense: 120 mL; Refill: 0 - Pt endorses improvement following breathing  treatment. Lung sounds improved on PE. Plan to cover with prednisone, azithromycin and Hycodan. Discussed at length need to control seasonal allergies. Push fluids. RTC in 7 days if no improvement. Consider chest x-ray at that time.   Marco CollieWhitney Tico Crotteau, PA-C  [facility] at [facility] 11/14/2017 10:33 AM

## 2017-11-14 NOTE — Patient Instructions (Addendum)
You have seasonal allergies. These symptoms need to be controlled bc they are difficult to manage once they get bad.  Start taking claritin-D on a daily basis. Use nasal spray twice daily.  If your eyes are itchy use an over-the-counter eye drop (naphcon-A or Opcon-A) Use a neti pot to rinse your nasal passages.  Put an air purifier in your room.   Azithromycin is an antibiotic. Start taking this as directed.  Prednisone 6m for the next 5 days.  Hycodan is a hydrocodone-based cough medication. Use this only as directed. This may make you drowsy. Do not take with any other medicines that make you drowsy.  Use your Albuterol inhaler every 4-6 hours as needed for wheezing.   Come back if you are not better in 5-7 days.   Thank you for coming in today. I hope you feel we met your needs.  Feel free to call PCP if you have any questions or further requests.  Please consider signing up for MyChart if you do not already have it, as this is a great way to communicate with me.  Best,  Whitney McVey, PA-C   IF you received an x-ray today, you will receive an invoice from [facility]. Please contact [facility] at [phone] questions or concerns regarding your invoice.   IF you received labwork today, you will receive an invoice from [facility] Please contact [facility] at [phone] questions or concerns regarding your invoice.   Our billing staff will not be able to assist you with questions regarding bills from these companies.  You will be contacted with the lab results as soon as they are available. The fastest way to get your results is to activate your My Chart account. Instructions are located on the last page of this paperwork. If you have not heard from [facility] the results in 2 weeks, please contact this office.

## 2017-12-08 IMAGING — DX DG CHEST 2V
2 series · 2 of 2 positions shown · non-contrast
Comparison: none

CLINICAL DATA: Follow up visit . Still coughing, patient concerned
for pneumonia. Please rule out focal pneumonia

EXAM:
CHEST - 2 VIEW

[chest pa]
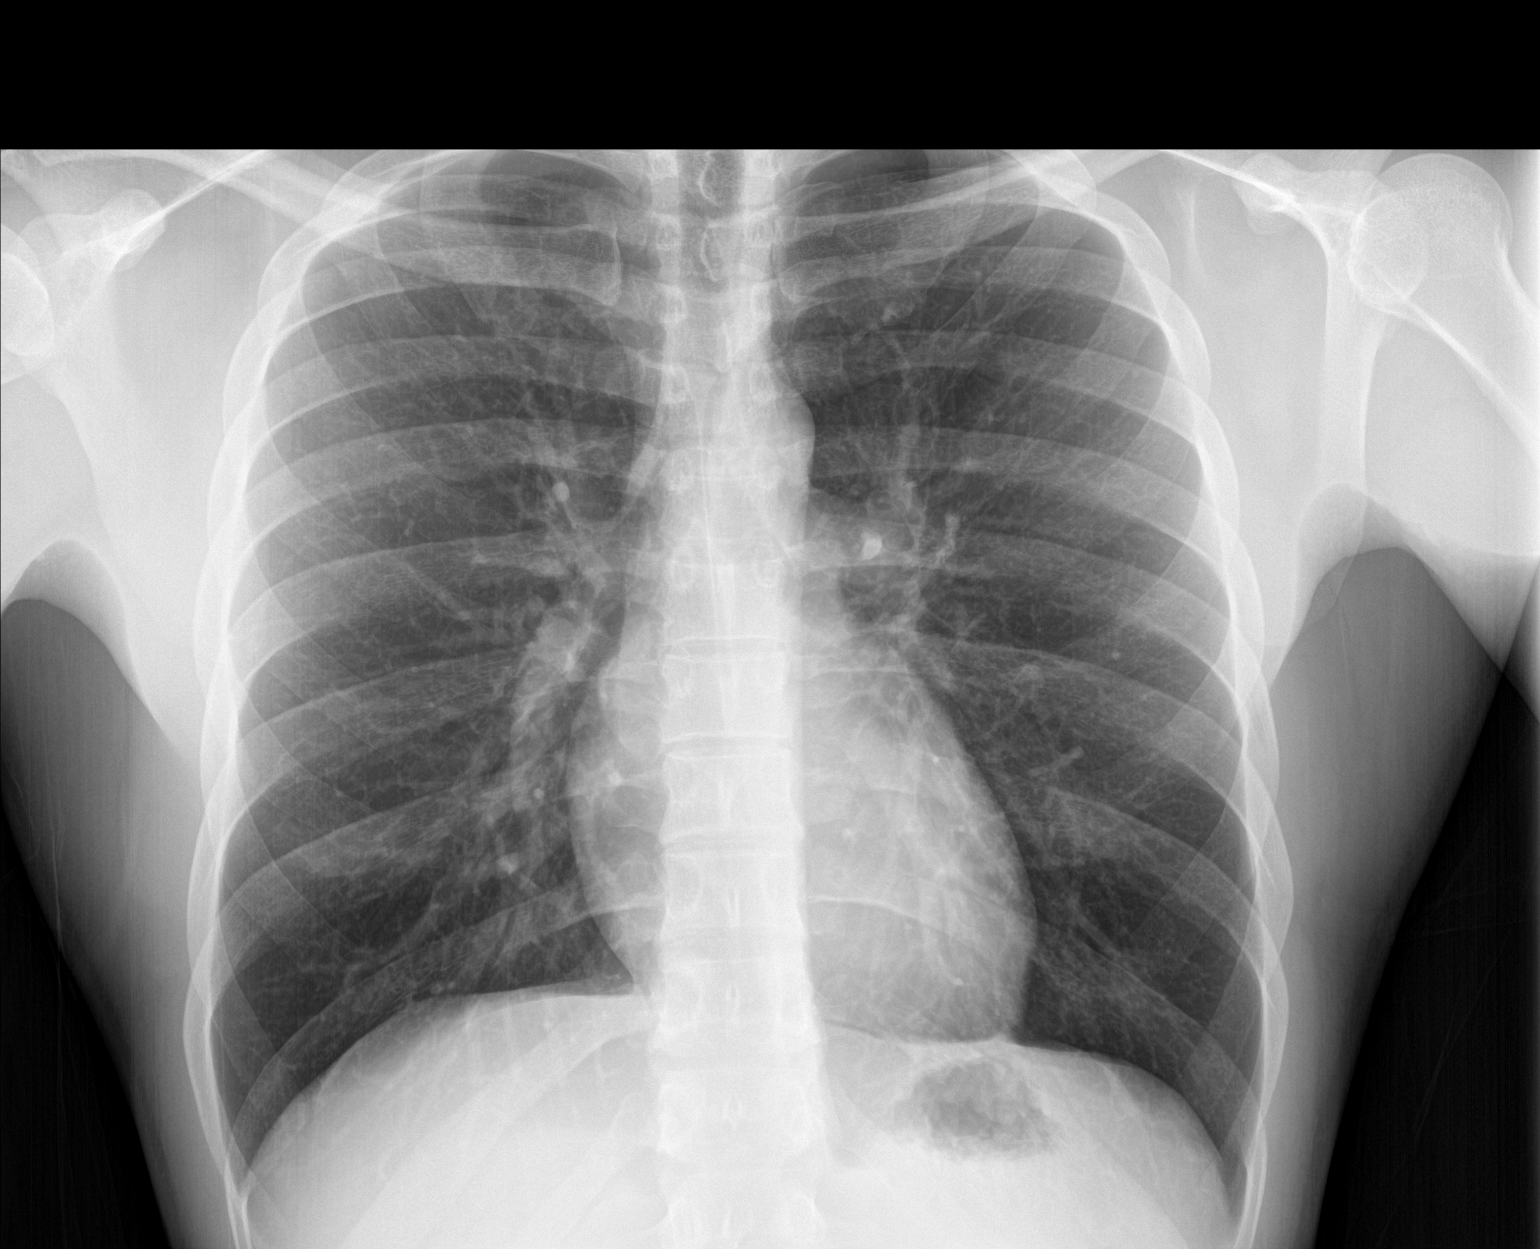

[chest lat]
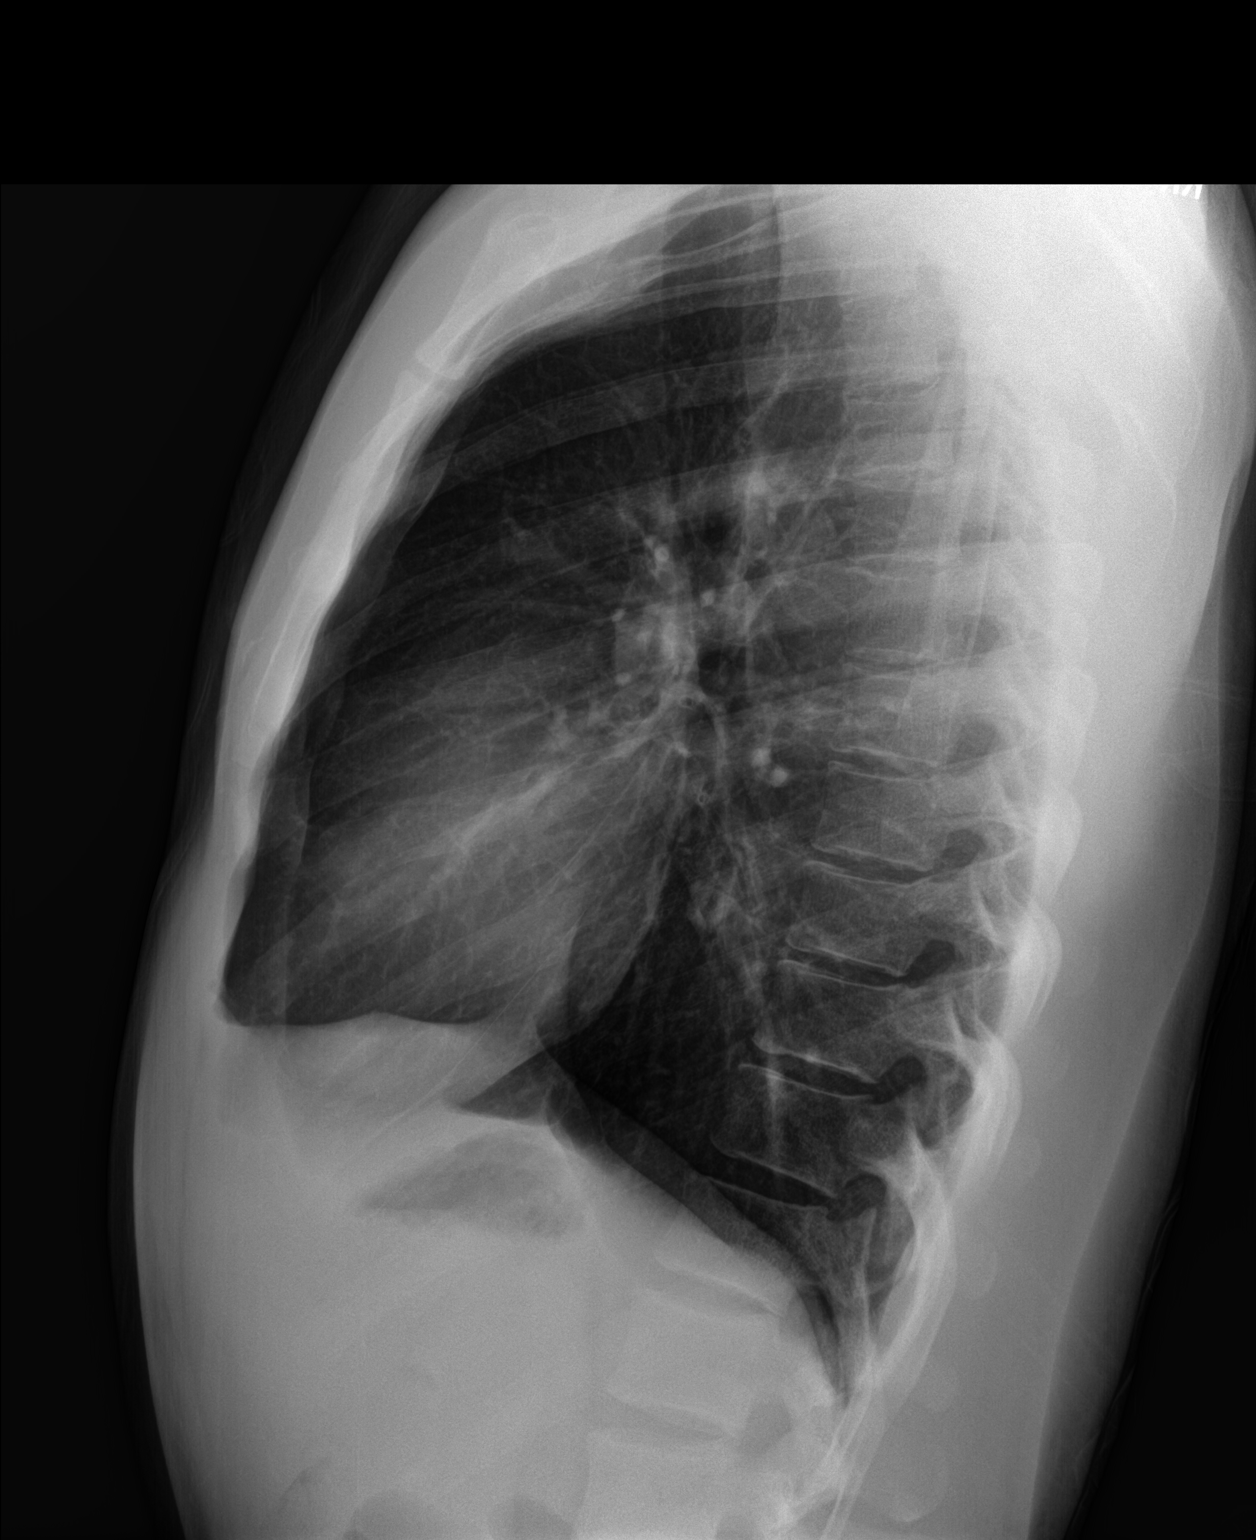

[2 of 2 positions shown; findings below may reference images not displayed]

FINDINGS: Lungs are clear, mildly hyperinflated.

Heart size and mediastinal contours are within normal limits.

No effusion.

Visualized bones unremarkable.
IMPRESSION: No acute cardiopulmonary disease.

## 2018-11-05 ENCOUNTER — Ambulatory Visit (INDEPENDENT_AMBULATORY_CARE_PROVIDER_SITE_OTHER): Payer: Self-pay

## 2018-11-05 ENCOUNTER — Encounter (HOSPITAL_COMMUNITY): Payer: Self-pay | Admitting: Emergency Medicine

## 2018-11-05 ENCOUNTER — Ambulatory Visit (HOSPITAL_COMMUNITY)
Admission: EM | Admit: 2018-11-05 | Discharge: 2018-11-05 | Disposition: A | Discharge status: Home / Self Care | Payer: Self-pay | Attending: Family Medicine | Admitting: Family Medicine

## 2018-11-05 ENCOUNTER — Other Ambulatory Visit: Payer: Self-pay

## 2018-11-05 DIAGNOSIS — J181 Lobar pneumonia, unspecified organism: Secondary | ICD-10-CM

## 2018-11-05 DIAGNOSIS — J189 Pneumonia, unspecified organism: Secondary | ICD-10-CM

## 2018-11-05 DIAGNOSIS — R062 Wheezing: Secondary | ICD-10-CM

## 2018-11-05 DIAGNOSIS — R05 Cough: Secondary | ICD-10-CM

## 2018-11-05 MED ORDER — AMOXICILLIN-POT CLAVULANATE 875-125 MG PO TABS: 1.0000 | ORAL_TABLET | Freq: Two times a day (BID) | ORAL | 0 refills | Status: AC

## 2018-11-05 MED ORDER — DM-GUAIFENESIN ER 30-600 MG PO TB12: 1.0000 | ORAL_TABLET | Freq: Two times a day (BID) | ORAL | 0 refills | Status: AC

## 2018-11-05 NOTE — Discharge Instructions (Signed)
Your x-ray did show pneumonia We will treat this with Augmentin twice a day for 7 days Mucinex DM for cough, congestion and to thin mucus Keep using your albuterol inhaler as needed for cough, wheezing, shortness of breath Follow up as needed for continued or worsening symptoms

## 2018-11-05 NOTE — ED Provider Notes (Signed)
[facility]    CSN: [id number] Arrival date & time: 11/05/18  1130     History   Chief Complaint Chief Complaint  Patient presents with  . URI    HPI Henry Hall is a 28 y.o. male.   Patient is a 28 year old male with past medical history of allergies.  He presents with approximately 3 days of cough, congestion, wheezing.  He has had similar symptoms in the past with bronchitis.  He has been using an old inhaler that he had with some relief.  He is also been using over-the-counter cold medication without much relief.  He is not currently taking any allergy medicine.  He did have some blood-tinged sputum.  Low-grade fever with myalgias. Denies any recent sick contacts or recent traveling.  Denies any chest pain, shortness of breath.  ROS per HPI      Past Medical History:  Diagnosis Date  . Allergy     There are no active problems to display for this patient.   History reviewed. No pertinent surgical history.     Home Medications    Prior to Admission medications   Medication Sig Start Date End Date Taking? Authorizing Provider  albuterol (PROVENTIL HFA;VENTOLIN HFA) 108 (90 Base) MCG/ACT inhaler Inhale 1-2 puffs into the lungs every 6 (six) hours as needed for wheezing or shortness of breath. 11/02/17  Yes McVey, Madelaine Bhat, PA-C  amoxicillin-clavulanate (AUGMENTIN) 875-125 MG tablet Take 1 tablet by mouth every 12 (twelve) hours. 11/05/18   Dahlia Byes A, NP  dextromethorphan-guaiFENesin (MUCINEX DM) 30-600 MG 12hr tablet Take 1 tablet by mouth 2 (two) times daily. 11/05/18   Janace Aris, NP    Family History History reviewed. No pertinent family history.  Social History Social History   Tobacco Use  . Smoking status: Never Smoker  . Smokeless tobacco: Never Used  Substance Use Topics  . Alcohol use: No  . Drug use: No     Allergies   Patient has no known allergies.   Review of Systems Review of Systems   Physical Exam Triage  Vital Signs ED Triage Vitals  Enc Vitals Group     BP 11/05/18 1240 (!) 127/96     Pulse --      Resp --      Temp 11/05/18 1240 99.7 F (37.6 C)     Temp Source 11/05/18 1240 Temporal     SpO2 11/05/18 1240 100 %     Weight --      Height --      Head Circumference --      Peak Flow --      Pain Score 11/05/18 1241 0     Pain Loc --      Pain Edu? --      Excl. in GC? --    No data found.  Updated Vital Signs BP (!) 127/96 (BP Location: Right Arm)   Temp 99.7 F (37.6 C) (Temporal)   SpO2 100%   Visual Acuity Right Eye Distance:   Left Eye Distance:   Bilateral Distance:    Right Eye Near:   Left Eye Near:    Bilateral Near:     Physical Exam Vitals signs and nursing note reviewed.  Constitutional:      General: He is not in acute distress.    Appearance: Normal appearance. He is well-developed. He is not ill-appearing, toxic-appearing or diaphoretic.  HENT:     Head: Normocephalic and atraumatic.  Right Ear: Tympanic membrane and ear canal normal.     Left Ear: Tympanic membrane and ear canal normal.     Nose: Congestion present.     Mouth/Throat:     Pharynx: Oropharynx is clear.  Eyes:     Conjunctiva/sclera: Conjunctivae normal.  Neck:     Musculoskeletal: Normal range of motion and neck supple.  Cardiovascular:     Rate and Rhythm: Normal rate and regular rhythm.     Heart sounds: No murmur.  Pulmonary:     Effort: Pulmonary effort is normal. No respiratory distress.     Breath sounds: Wheezing present.     Comments: Expiratory wheezes and rhonchi noted in both lung bases Musculoskeletal: Normal range of motion.  Lymphadenopathy:     Cervical: No cervical adenopathy.  Skin:    General: Skin is warm and dry.  Neurological:     Mental Status: He is alert.  Psychiatric:        Mood and Affect: Mood normal.      UC Treatments / Results  Labs (all labs ordered are listed, but only abnormal results are displayed) Labs Reviewed - No data to  display  EKG None  Radiology Dg Chest 2 View  Result Date: 11/05/2018 CLINICAL DATA:  Cough and wheezing for 3 days. EXAM: CHEST - 2 VIEW COMPARISON:  Two-view chest x-ray 01/08/2017 FINDINGS: Heart size is normal. Superior segment left lower lobe pneumonia is now present. The right lung is clear. There is no edema or effusion. The visualized soft tissues and bony thorax are unremarkable. IMPRESSION: 1. New superior segment left lower lobe pneumonia. Electronically Signed   By: Marin Roberts M.D.   On: 11/05/2018 13:41    Procedures Procedures (including critical care time)  Medications Ordered in UC Medications - No data to display  Initial Impression / Assessment and Plan / UC Course  I have reviewed the triage vital signs and the nursing notes.  Pertinent labs & imaging results that were available during my care of the patient were reviewed by me and considered in my medical decision making (see chart for details).     X-ray revealed left lower lobe pneumonia We will treat this with Augmentin twice a day for 7 days Mucinex DM for cough, congestion He can use his albuterol inhaler as needed for cough, wheezing, shortness of breath Follow up as needed for continued or worsening symptoms  Final Clinical Impressions(s) / UC Diagnoses   Final diagnoses:  Community acquired pneumonia of left lower lobe of lung (HCC)     Discharge Instructions     Your x-ray did show pneumonia We will treat this with Augmentin twice a day for 7 days Mucinex DM for cough, congestion and to thin mucus Keep using your albuterol inhaler as needed for cough, wheezing, shortness of breath Follow up as needed for continued or worsening symptoms     ED Prescriptions    Medication Sig Dispense Auth. Provider   amoxicillin-clavulanate (AUGMENTIN) 875-125 MG tablet Take 1 tablet by mouth every 12 (twelve) hours. 14 tablet Naseer Hearn A, NP   dextromethorphan-guaiFENesin (MUCINEX DM) 30-600  MG 12hr tablet Take 1 tablet by mouth 2 (two) times daily. 14 tablet Dahlia Byes A, NP     Controlled Substance Prescriptions  [place] Controlled Substance Registry consulted? Not Applicable   Janace Aris, NP 11/05/18 1350

## 2018-11-05 NOTE — ED Triage Notes (Signed)
Pt complains of congestion in his throat and cough x3 days.  Pt has not had a fever.

## 2018-11-11 ENCOUNTER — Ambulatory Visit (HOSPITAL_COMMUNITY)
Admission: EM | Admit: 2018-11-11 | Discharge: 2018-11-11 | Disposition: A | Discharge status: Home / Self Care | Payer: Self-pay | Attending: Internal Medicine | Admitting: Internal Medicine

## 2018-11-11 ENCOUNTER — Encounter (HOSPITAL_COMMUNITY): Payer: Self-pay | Admitting: Emergency Medicine

## 2018-11-11 DIAGNOSIS — J189 Pneumonia, unspecified organism: Secondary | ICD-10-CM

## 2018-11-11 DIAGNOSIS — J181 Lobar pneumonia, unspecified organism: Secondary | ICD-10-CM

## 2018-11-11 DIAGNOSIS — R0602 Shortness of breath: Secondary | ICD-10-CM

## 2018-11-11 MED ORDER — PREDNISONE 10 MG PO TABS: 20.0000 mg | ORAL_TABLET | Freq: Every day | ORAL | 0 refills | Status: AC

## 2018-11-11 MED ORDER — ALBUTEROL SULFATE HFA 108 (90 BASE) MCG/ACT IN AERS: 1.0000 | INHALATION_SPRAY | Freq: Four times a day (QID) | RESPIRATORY_TRACT | 1 refills | Status: AC | PRN

## 2018-11-11 NOTE — ED Triage Notes (Signed)
Pt here for follow up after being diagnosed with PNA recently

## 2018-11-11 NOTE — ED Provider Notes (Signed)
[facility]    CSN: [id number] Arrival date & time: 11/11/18  1237     History   Chief Complaint Chief Complaint  Patient presents with  . Follow-up    appt 1245    HPI Henry Hall is a 28 y.o. male.   28 year old male returns to clinic for follow-up of pneumonia diagnosed last week.  He has 1 more day of Augmentin.  He reports that he feels better overall but occasionally still has shortness of breath.  He had an inhaler with 36 doses and now has 10 left.  He continues to cough but it is no longer productive.     Past Medical History:  Diagnosis Date  . Allergy     There are no active problems to display for this patient.   History reviewed. No pertinent surgical history.     Home Medications    Prior to Admission medications   Medication Sig Start Date End Date Taking? Authorizing Provider  albuterol (PROVENTIL HFA;VENTOLIN HFA) 108 (90 Base) MCG/ACT inhaler Inhale 1-2 puffs into the lungs every 6 (six) hours as needed for wheezing or shortness of breath. 11/11/18   Arnaldo Natal, MD  amoxicillin-clavulanate (AUGMENTIN) 875-125 MG tablet Take 1 tablet by mouth every 12 (twelve) hours. 11/05/18   Dahlia Byes A, NP  dextromethorphan-guaiFENesin (MUCINEX DM) 30-600 MG 12hr tablet Take 1 tablet by mouth 2 (two) times daily. 11/05/18   Dahlia Byes A, NP  predniSONE (DELTASONE) 10 MG tablet Take 2 tablets (20 mg total) by mouth daily for 4 days. 11/11/18 11/15/18  Arnaldo Natal, MD    Family History Family History  Problem Relation Age of Onset  . CAD Father     Social History Social History   Tobacco Use  . Smoking status: Never Smoker  . Smokeless tobacco: Never Used  Substance Use Topics  . Alcohol use: No  . Drug use: No     Allergies   Patient has no known allergies.   Review of Systems Review of Systems  Constitutional: Negative for chills and fever.  HENT: Negative for sore throat and tinnitus.   Eyes: Negative for redness.    Respiratory: Positive for cough. Negative for shortness of breath.   Cardiovascular: Negative for chest pain and palpitations.  Gastrointestinal: Negative for abdominal pain, diarrhea, nausea and vomiting.  Genitourinary: Negative for dysuria, frequency and urgency.  Musculoskeletal: Negative for myalgias.  Skin: Negative for rash.       No lesions  Neurological: Negative for weakness.  Hematological: Does not bruise/bleed easily.  Psychiatric/Behavioral: Negative for suicidal ideas.     Physical Exam Triage Vital Signs ED Triage Vitals [11/11/18 1305]  Enc Vitals Group     BP (!) 143/89     Pulse Rate 72     Resp 18     Temp 98.3 F (36.8 C)     Temp Source Temporal     SpO2 97 %     Weight      Height      Head Circumference      Peak Flow      Pain Score 0     Pain Loc      Pain Edu?      Excl. in GC?    No data found.  Updated Vital Signs BP (!) 143/89 (BP Location: Right Arm)   Pulse 72   Temp 98.3 F (36.8 C) (Temporal)   Resp 18   SpO2 97%   Visual  Acuity Right Eye Distance:   Left Eye Distance:   Bilateral Distance:    Right Eye Near:   Left Eye Near:    Bilateral Near:     Physical Exam Constitutional:      General: He is not in acute distress.    Appearance: He is well-developed.  HENT:     Head: Normocephalic and atraumatic.  Eyes:     General: No scleral icterus.    Conjunctiva/sclera: Conjunctivae normal.     Pupils: Pupils are equal, round, and reactive to light.  Neck:     Musculoskeletal: Normal range of motion and neck supple.     Thyroid: No thyromegaly.     Vascular: No JVD.     Trachea: No tracheal deviation.  Cardiovascular:     Rate and Rhythm: Normal rate and regular rhythm.     Heart sounds: Normal heart sounds. No murmur. No friction rub. No gallop.   Pulmonary:     Effort: Pulmonary effort is normal. No respiratory distress.     Breath sounds: Wheezing and rhonchi present.  Abdominal:     General: Bowel sounds are  normal. There is no distension.     Palpations: Abdomen is soft.     Tenderness: There is no abdominal tenderness.  Musculoskeletal: Normal range of motion.  Lymphadenopathy:     Cervical: No cervical adenopathy.  Skin:    General: Skin is warm and dry.     Findings: No erythema or rash.  Neurological:     Mental Status: He is alert and oriented to person, place, and time.     Cranial Nerves: No cranial nerve deficit.  Psychiatric:        Behavior: Behavior normal.        Thought Content: Thought content normal.        Judgment: Judgment normal.      UC Treatments / Results  Labs (all labs ordered are listed, but only abnormal results are displayed) Labs Reviewed - No data to display  EKG None  Radiology No results found.  Procedures Procedures (including critical care time)  Medications Ordered in UC Medications - No data to display  Initial Impression / Assessment and Plan / UC Course  I have reviewed the triage vital signs and the nursing notes.  Pertinent labs & imaging results that were available during my care of the patient were reviewed by me and considered in my medical decision making (see chart for details).     Overall improvement but continues to have wheezes in the left upper lobe as well as occasional rhonchi that clear with cough.  I have prescribed a pulse of steroids and refill the patient's inhaler.  Final Clinical Impressions(s) / UC Diagnoses   Final diagnoses:  Community acquired pneumonia of left upper lobe of lung [facility])   Discharge Instructions   None    ED Prescriptions    Medication Sig Dispense Auth. Provider   albuterol (PROVENTIL HFA;VENTOLIN HFA) 108 (90 Base) MCG/ACT inhaler Inhale 1-2 puffs into the lungs every 6 (six) hours as needed for wheezing or shortness of breath. 1 Inhaler Arnaldo Natal, MD   predniSONE (DELTASONE) 10 MG tablet Take 2 tablets (20 mg total) by mouth daily for 4 days. 8 tablet Arnaldo Natal, MD       Controlled Substance Prescriptions  Controlled Substance Registry consulted? Not Applicable   Arnaldo Natal, MD 11/11/18 1325
